# Patient Record
Sex: Male | Born: 1950
Health system: Southern US, Community
[De-identification: ages and names within clinical notes are randomized; demographics above are authoritative.]

## PROBLEM LIST (undated history)

## (undated) DIAGNOSIS — E785 Hyperlipidemia, unspecified: Secondary | ICD-10-CM

## (undated) HISTORY — PX: TONSILLECTOMY: SUR1361

## (undated) HISTORY — DX: Hyperlipidemia, unspecified: E78.5

---

## 2014-08-08 LAB — HM COLONOSCOPY

## 2015-08-09 DIAGNOSIS — H2513 Age-related nuclear cataract, bilateral: Secondary | ICD-10-CM | POA: Diagnosis not present

## 2015-08-09 DIAGNOSIS — Z79899 Other long term (current) drug therapy: Secondary | ICD-10-CM | POA: Diagnosis not present

## 2015-08-09 DIAGNOSIS — Z125 Encounter for screening for malignant neoplasm of prostate: Secondary | ICD-10-CM | POA: Diagnosis not present

## 2015-08-09 DIAGNOSIS — Q828 Other specified congenital malformations of skin: Secondary | ICD-10-CM | POA: Diagnosis not present

## 2015-08-09 DIAGNOSIS — H16223 Keratoconjunctivitis sicca, not specified as Sjogren's, bilateral: Secondary | ICD-10-CM | POA: Diagnosis not present

## 2015-08-09 DIAGNOSIS — Z23 Encounter for immunization: Secondary | ICD-10-CM | POA: Diagnosis not present

## 2015-08-09 DIAGNOSIS — E785 Hyperlipidemia, unspecified: Secondary | ICD-10-CM | POA: Diagnosis not present

## 2016-02-12 DIAGNOSIS — E785 Hyperlipidemia, unspecified: Secondary | ICD-10-CM | POA: Diagnosis not present

## 2016-02-12 DIAGNOSIS — L821 Other seborrheic keratosis: Secondary | ICD-10-CM | POA: Diagnosis not present

## 2016-02-12 DIAGNOSIS — Z79899 Other long term (current) drug therapy: Secondary | ICD-10-CM | POA: Diagnosis not present

## 2016-02-12 DIAGNOSIS — Q828 Other specified congenital malformations of skin: Secondary | ICD-10-CM | POA: Diagnosis not present

## 2016-02-12 DIAGNOSIS — L570 Actinic keratosis: Secondary | ICD-10-CM | POA: Diagnosis not present

## 2016-02-12 DIAGNOSIS — Z1283 Encounter for screening for malignant neoplasm of skin: Secondary | ICD-10-CM | POA: Diagnosis not present

## 2016-02-12 DIAGNOSIS — Z125 Encounter for screening for malignant neoplasm of prostate: Secondary | ICD-10-CM | POA: Diagnosis not present

## 2016-02-14 DIAGNOSIS — E785 Hyperlipidemia, unspecified: Secondary | ICD-10-CM | POA: Diagnosis not present

## 2016-02-14 DIAGNOSIS — Z23 Encounter for immunization: Secondary | ICD-10-CM | POA: Diagnosis not present

## 2016-02-14 DIAGNOSIS — Z0000 Encounter for general adult medical examination without abnormal findings: Secondary | ICD-10-CM | POA: Diagnosis not present

## 2016-02-14 DIAGNOSIS — Z79899 Other long term (current) drug therapy: Secondary | ICD-10-CM | POA: Diagnosis not present

## 2016-08-05 DIAGNOSIS — H16223 Keratoconjunctivitis sicca, not specified as Sjogren's, bilateral: Secondary | ICD-10-CM | POA: Diagnosis not present

## 2016-08-05 DIAGNOSIS — L570 Actinic keratosis: Secondary | ICD-10-CM | POA: Diagnosis not present

## 2016-08-05 DIAGNOSIS — H2513 Age-related nuclear cataract, bilateral: Secondary | ICD-10-CM | POA: Diagnosis not present

## 2016-08-05 DIAGNOSIS — D485 Neoplasm of uncertain behavior of skin: Secondary | ICD-10-CM | POA: Diagnosis not present

## 2016-08-05 DIAGNOSIS — L738 Other specified follicular disorders: Secondary | ICD-10-CM | POA: Diagnosis not present

## 2016-08-09 DIAGNOSIS — Z0000 Encounter for general adult medical examination without abnormal findings: Secondary | ICD-10-CM | POA: Diagnosis not present

## 2016-08-09 DIAGNOSIS — Z23 Encounter for immunization: Secondary | ICD-10-CM | POA: Diagnosis not present

## 2016-08-09 DIAGNOSIS — E785 Hyperlipidemia, unspecified: Secondary | ICD-10-CM | POA: Diagnosis not present

## 2016-08-15 DIAGNOSIS — Z4802 Encounter for removal of sutures: Secondary | ICD-10-CM | POA: Diagnosis not present

## 2017-02-17 DIAGNOSIS — Z1283 Encounter for screening for malignant neoplasm of skin: Secondary | ICD-10-CM | POA: Diagnosis not present

## 2017-02-17 DIAGNOSIS — L820 Inflamed seborrheic keratosis: Secondary | ICD-10-CM | POA: Diagnosis not present

## 2017-02-17 DIAGNOSIS — L570 Actinic keratosis: Secondary | ICD-10-CM | POA: Diagnosis not present

## 2017-02-17 DIAGNOSIS — L821 Other seborrheic keratosis: Secondary | ICD-10-CM | POA: Diagnosis not present

## 2017-02-17 DIAGNOSIS — E785 Hyperlipidemia, unspecified: Secondary | ICD-10-CM | POA: Diagnosis not present

## 2017-04-03 DIAGNOSIS — Z23 Encounter for immunization: Secondary | ICD-10-CM | POA: Diagnosis not present

## 2017-08-20 DIAGNOSIS — Z1283 Encounter for screening for malignant neoplasm of skin: Secondary | ICD-10-CM | POA: Diagnosis not present

## 2017-08-20 DIAGNOSIS — L718 Other rosacea: Secondary | ICD-10-CM | POA: Diagnosis not present

## 2017-08-20 DIAGNOSIS — L570 Actinic keratosis: Secondary | ICD-10-CM | POA: Diagnosis not present

## 2017-08-26 DIAGNOSIS — R82998 Other abnormal findings in urine: Secondary | ICD-10-CM | POA: Diagnosis not present

## 2017-08-26 DIAGNOSIS — M859 Disorder of bone density and structure, unspecified: Secondary | ICD-10-CM | POA: Diagnosis not present

## 2017-08-26 DIAGNOSIS — E298 Other testicular dysfunction: Secondary | ICD-10-CM | POA: Diagnosis not present

## 2017-08-26 DIAGNOSIS — Z125 Encounter for screening for malignant neoplasm of prostate: Secondary | ICD-10-CM | POA: Diagnosis not present

## 2017-08-26 DIAGNOSIS — J302 Other seasonal allergic rhinitis: Secondary | ICD-10-CM | POA: Diagnosis not present

## 2017-08-26 DIAGNOSIS — R972 Elevated prostate specific antigen [PSA]: Secondary | ICD-10-CM | POA: Diagnosis not present

## 2017-08-26 DIAGNOSIS — Z1389 Encounter for screening for other disorder: Secondary | ICD-10-CM | POA: Diagnosis not present

## 2017-08-26 DIAGNOSIS — Z6823 Body mass index (BMI) 23.0-23.9, adult: Secondary | ICD-10-CM | POA: Diagnosis not present

## 2017-08-26 DIAGNOSIS — Z0000 Encounter for general adult medical examination without abnormal findings: Secondary | ICD-10-CM | POA: Diagnosis not present

## 2017-08-26 DIAGNOSIS — E7800 Pure hypercholesterolemia, unspecified: Secondary | ICD-10-CM | POA: Diagnosis not present

## 2017-08-27 LAB — TSH: TSH: 1.45 (ref <=5.90)

## 2017-09-10 DIAGNOSIS — M859 Disorder of bone density and structure, unspecified: Secondary | ICD-10-CM | POA: Diagnosis not present

## 2017-09-11 DIAGNOSIS — Z1212 Encounter for screening for malignant neoplasm of rectum: Secondary | ICD-10-CM | POA: Diagnosis not present

## 2017-10-01 DIAGNOSIS — J209 Acute bronchitis, unspecified: Secondary | ICD-10-CM | POA: Diagnosis not present

## 2018-02-24 DIAGNOSIS — L709 Acne, unspecified: Secondary | ICD-10-CM | POA: Diagnosis not present

## 2018-02-24 DIAGNOSIS — Z23 Encounter for immunization: Secondary | ICD-10-CM | POA: Diagnosis not present

## 2018-02-24 DIAGNOSIS — Z6822 Body mass index (BMI) 22.0-22.9, adult: Secondary | ICD-10-CM | POA: Diagnosis not present

## 2018-04-17 DIAGNOSIS — H04123 Dry eye syndrome of bilateral lacrimal glands: Secondary | ICD-10-CM | POA: Diagnosis not present

## 2018-04-17 DIAGNOSIS — H2513 Age-related nuclear cataract, bilateral: Secondary | ICD-10-CM | POA: Diagnosis not present

## 2018-04-17 DIAGNOSIS — H5213 Myopia, bilateral: Secondary | ICD-10-CM | POA: Diagnosis not present

## 2018-08-03 DIAGNOSIS — L578 Other skin changes due to chronic exposure to nonionizing radiation: Secondary | ICD-10-CM | POA: Diagnosis not present

## 2018-08-03 DIAGNOSIS — L718 Other rosacea: Secondary | ICD-10-CM | POA: Diagnosis not present

## 2018-08-03 DIAGNOSIS — L820 Inflamed seborrheic keratosis: Secondary | ICD-10-CM | POA: Diagnosis not present

## 2018-08-18 DIAGNOSIS — R82998 Other abnormal findings in urine: Secondary | ICD-10-CM | POA: Diagnosis not present

## 2018-08-18 DIAGNOSIS — Z125 Encounter for screening for malignant neoplasm of prostate: Secondary | ICD-10-CM | POA: Diagnosis not present

## 2018-08-18 DIAGNOSIS — E7800 Pure hypercholesterolemia, unspecified: Secondary | ICD-10-CM | POA: Diagnosis not present

## 2018-08-18 DIAGNOSIS — E291 Testicular hypofunction: Secondary | ICD-10-CM | POA: Diagnosis not present

## 2018-08-18 LAB — LIPID PANEL
Cholesterol: 160 (ref 0–200)
HDL: 46 (ref 35–70)
LDL Cholesterol: 87
Triglycerides: 135 (ref 40–160)

## 2018-08-18 LAB — PSA: PSA: 0.872

## 2018-08-18 LAB — BASIC METABOLIC PANEL
BUN: 13 (ref 4–21)
Creatinine: 1.1 (ref <=1.3)
Glucose: 97

## 2018-08-26 DIAGNOSIS — Z1331 Encounter for screening for depression: Secondary | ICD-10-CM | POA: Diagnosis not present

## 2018-08-26 DIAGNOSIS — E7800 Pure hypercholesterolemia, unspecified: Secondary | ICD-10-CM | POA: Diagnosis not present

## 2018-08-26 DIAGNOSIS — Z6822 Body mass index (BMI) 22.0-22.9, adult: Secondary | ICD-10-CM | POA: Diagnosis not present

## 2018-08-26 DIAGNOSIS — M859 Disorder of bone density and structure, unspecified: Secondary | ICD-10-CM | POA: Diagnosis not present

## 2018-08-26 DIAGNOSIS — R972 Elevated prostate specific antigen [PSA]: Secondary | ICD-10-CM | POA: Diagnosis not present

## 2018-08-26 DIAGNOSIS — Z1339 Encounter for screening examination for other mental health and behavioral disorders: Secondary | ICD-10-CM | POA: Diagnosis not present

## 2018-08-26 DIAGNOSIS — J302 Other seasonal allergic rhinitis: Secondary | ICD-10-CM | POA: Diagnosis not present

## 2018-08-26 DIAGNOSIS — E291 Testicular hypofunction: Secondary | ICD-10-CM | POA: Diagnosis not present

## 2018-08-26 DIAGNOSIS — Z0000 Encounter for general adult medical examination without abnormal findings: Secondary | ICD-10-CM | POA: Diagnosis not present

## 2018-08-26 DIAGNOSIS — L708 Other acne: Secondary | ICD-10-CM | POA: Diagnosis not present

## 2018-09-01 DIAGNOSIS — Z1212 Encounter for screening for malignant neoplasm of rectum: Secondary | ICD-10-CM | POA: Diagnosis not present

## 2018-09-01 LAB — FECAL OCCULT BLOOD, GUAIAC: Fecal Occult Blood: NEGATIVE

## 2018-11-11 DIAGNOSIS — L280 Lichen simplex chronicus: Secondary | ICD-10-CM | POA: Diagnosis not present

## 2018-11-11 DIAGNOSIS — L821 Other seborrheic keratosis: Secondary | ICD-10-CM | POA: Diagnosis not present

## 2018-11-11 DIAGNOSIS — L814 Other melanin hyperpigmentation: Secondary | ICD-10-CM | POA: Diagnosis not present

## 2018-11-11 DIAGNOSIS — D1801 Hemangioma of skin and subcutaneous tissue: Secondary | ICD-10-CM | POA: Diagnosis not present

## 2018-11-11 DIAGNOSIS — L570 Actinic keratosis: Secondary | ICD-10-CM | POA: Diagnosis not present

## 2018-11-24 ENCOUNTER — Encounter: Payer: Self-pay | Admitting: Family Medicine

## 2018-11-24 ENCOUNTER — Ambulatory Visit (INDEPENDENT_AMBULATORY_CARE_PROVIDER_SITE_OTHER): Payer: Medicare Other | Admitting: Family Medicine

## 2018-11-24 ENCOUNTER — Other Ambulatory Visit: Payer: Self-pay

## 2018-11-24 DIAGNOSIS — E785 Hyperlipidemia, unspecified: Secondary | ICD-10-CM | POA: Diagnosis not present

## 2018-11-24 NOTE — Progress Notes (Signed)
Chief Complaint:  Jacob Hood is a 68 y.o. male who presents today for a virtual office visit with a chief complaint of dyslipidemia and to establish care.   Assessment/Plan:  Dyslipidemia Stable.  Continue simvastatin 20 mg daily.  Check lipid panel next blood draw.  Obtain records from previous PCP.  Preventative Healthcare Health Maintenance Due  Topic Date Due  . Hepatitis C Screening  1951-02-15  . PNA vac Low Risk Adult (1 of 2 - PCV13) 11/30/2013     Subjective:  HPI:  His stable, chronic medical conditions are outlined below:  # Dyslipidemia - On simvastatin 20mg  daily and tolerating well withoutside effects  - ROS: No reported myalgias.  ROS: Per HPI, otherwise a complete review of systems was negative.   PMH:  The following were reviewed and entered/updated in epic: Past Medical History:  Diagnosis Date  . Hyperlipidemia    Patient Active Problem List   Diagnosis Date Noted  . Dyslipidemia 11/24/2018   Past Surgical History:  Procedure Laterality Date  . TONSILLECTOMY      Family History  Problem Relation Age of Onset  . Dementia Mother   . Heart attack Father   . Cancer Neg Hx     Medications- reviewed and updated Current Outpatient Medications  Medication Sig Dispense Refill  . simvastatin (ZOCOR) 20 MG tablet Take 20 mg by mouth daily.     No current facility-administered medications for this visit.     Allergies-reviewed and updated Allergies  Allergen Reactions  . Penicillins     Social History   Socioeconomic History  . Marital status: Not on file    Spouse name: Not on file  . Number of children: Not on file  . Years of education: Not on file  . Highest education level: Not on file  Occupational History  . Not on file  Social Needs  . Financial resource strain: Not on file  . Food insecurity:    Worry: Not on file    Inability: Not on file  . Transportation needs:    Medical: Not on file    Non-medical: Not on  file  Tobacco Use  . Smoking status: Former Research scientist (life sciences)  . Smokeless tobacco: Never Used  Substance and Sexual Activity  . Alcohol use: Yes    Comment: Socially  . Drug use: Never  . Sexual activity: Yes  Lifestyle  . Physical activity:    Days per week: Not on file    Minutes per session: Not on file  . Stress: Not on file  Relationships  . Social connections:    Talks on phone: Not on file    Gets together: Not on file    Attends religious service: Not on file    Active member of club or organization: Not on file    Attends meetings of clubs or organizations: Not on file    Relationship status: Not on file  Other Topics Concern  . Not on file  Social History Narrative  . Not on file        Objective/Observations  Physical Exam: Gen: NAD, resting comfortably Eyes: Extraocular movements intact.  No scleral icterus Cardiovascular: No peripheral edema Pulm: Normal work of breathing Neuro: Grossly normal, moves all extremities MSK: No digital cyanosis.  Full range of motion throughout Skin: No rashes or lesions. Psych: Normal affect and thought content  Virtual Visit via Video   I connected with Jacob Hood on 11/24/18 at 10:00 AM EDT by a  video enabled telemedicine application and verified that I am speaking with the correct person using two identifiers. I discussed the limitations of evaluation and management by telemedicine and the availability of in person appointments. The patient expressed understanding and agreed to proceed.   Patient location: Home Provider location: [place] participating in the virtual visit: Myself and Patient     Algis Greenhouse. Jerline Pain, MD 11/24/2018 1:06 PM

## 2018-11-24 NOTE — Assessment & Plan Note (Signed)
Stable.  Continue simvastatin 20 mg daily.  Check lipid panel next blood draw.  Obtain records from previous PCP.

## 2018-12-03 ENCOUNTER — Telehealth: Payer: Medicare Other | Admitting: Family

## 2018-12-03 DIAGNOSIS — L0390 Cellulitis, unspecified: Secondary | ICD-10-CM

## 2018-12-03 MED ORDER — SULFAMETHOXAZOLE-TRIMETHOPRIM 800-160 MG PO TABS: 1.0000 | ORAL_TABLET | Freq: Two times a day (BID) | ORAL | 0 refills | Status: DC

## 2018-12-03 NOTE — Progress Notes (Signed)
Greater than 5 minutes, yet less than 10 minutes of time have been spent researching, coordinating, and implementing care for this patient today.  Thank you for the details you included in the comment boxes. Those details are very helpful in determining the best course of treatment for you and help [place] to provide the best care.  This presents more like an insect bite or scratch that is becoming cellulitis.   E Visit for Cellulitis  We are sorry that you are not feeling well. Here is how we plan to help!  Based on what you shared with me it looks like you have cellulitis.  Cellulitis looks like areas of skin redness, swelling, and warmth; it develops as a result of bacteria entering under the skin. Little red spots and/or bleeding can be seen in skin, and tiny surface sacs containing fluid can occur. Fever can be present. Cellulitis is almost always on one side of a body, and the lower limbs are the most common site of involvement.   I have prescribed:  Bactrim DS 1 tablet by mouth BID for 7 days  HOME CARE:  . Take your medications as ordered and take all of them, even if the skin irritation appears to be healing.   GET HELP RIGHT AWAY IF:  . Symptoms that don't begin to go away within 48 hours. . Severe redness persists or worsens . If the area turns color, spreads or swells. . If it blisters and opens, develops yellow-brown crust or bleeds. . You develop a fever or chills. . If the pain increases or becomes unbearable.  . Are unable to keep fluids and food down.  MAKE SURE YOU    Understand these instructions.  Will watch your condition.  Will get help right away if you are not doing well or get worse.  Thank you for choosing an e-visit. Your e-visit answers were reviewed by a board certified advanced clinical practitioner to complete your personal care plan. Depending upon the condition, your plan could have included both over the counter or prescription medications. Please  review your pharmacy choice. Make sure the pharmacy is open so you can pick up prescription now. If there is a problem, you may contact your provider through [employer] and have the prescription routed to another pharmacy. Your safety is important to [place]. If you have drug allergies check your prescription carefully.  For the next 24 hours you can use MyChart to ask questions about today's visit, request a non-urgent call back, or ask for a work or school excuse. You will get an email in the next two days asking about your experience. I hope that your e-visit has been valuable and will speed your recovery.

## 2018-12-05 DIAGNOSIS — L0100 Impetigo, unspecified: Secondary | ICD-10-CM | POA: Diagnosis not present

## 2018-12-15 DIAGNOSIS — Z1159 Encounter for screening for other viral diseases: Secondary | ICD-10-CM | POA: Diagnosis not present

## 2019-02-16 DIAGNOSIS — R55 Syncope and collapse: Secondary | ICD-10-CM | POA: Diagnosis not present

## 2019-02-16 DIAGNOSIS — I959 Hypotension, unspecified: Secondary | ICD-10-CM | POA: Diagnosis not present

## 2019-02-16 DIAGNOSIS — R61 Generalized hyperhidrosis: Secondary | ICD-10-CM | POA: Diagnosis not present

## 2019-02-16 DIAGNOSIS — E860 Dehydration: Secondary | ICD-10-CM | POA: Diagnosis not present

## 2019-02-16 DIAGNOSIS — T671XXA Heat syncope, initial encounter: Secondary | ICD-10-CM | POA: Diagnosis not present

## 2019-02-16 DIAGNOSIS — R42 Dizziness and giddiness: Secondary | ICD-10-CM | POA: Diagnosis not present

## 2019-04-03 DIAGNOSIS — Z23 Encounter for immunization: Secondary | ICD-10-CM | POA: Diagnosis not present

## 2019-04-16 ENCOUNTER — Telehealth: Payer: Medicare Other

## 2019-04-16 ENCOUNTER — Encounter (HOSPITAL_COMMUNITY): Payer: Self-pay

## 2019-04-16 ENCOUNTER — Other Ambulatory Visit: Payer: Self-pay

## 2019-04-16 ENCOUNTER — Ambulatory Visit (HOSPITAL_COMMUNITY)
Admission: EM | Admit: 2019-04-16 | Discharge: 2019-04-16 | Disposition: A | Discharge status: Home / Self Care | Payer: Medicare Other | Attending: Urgent Care | Admitting: Urgent Care

## 2019-04-16 DIAGNOSIS — Z20828 Contact with and (suspected) exposure to other viral communicable diseases: Secondary | ICD-10-CM | POA: Insufficient documentation

## 2019-04-16 DIAGNOSIS — R519 Headache, unspecified: Secondary | ICD-10-CM | POA: Insufficient documentation

## 2019-04-16 DIAGNOSIS — R0981 Nasal congestion: Secondary | ICD-10-CM | POA: Insufficient documentation

## 2019-04-16 DIAGNOSIS — Z880 Allergy status to penicillin: Secondary | ICD-10-CM | POA: Insufficient documentation

## 2019-04-16 DIAGNOSIS — R42 Dizziness and giddiness: Secondary | ICD-10-CM | POA: Insufficient documentation

## 2019-04-16 DIAGNOSIS — E785 Hyperlipidemia, unspecified: Secondary | ICD-10-CM | POA: Diagnosis not present

## 2019-04-16 MED ORDER — CETIRIZINE HCL 10 MG PO TABS: 10.0000 mg | ORAL_TABLET | Freq: Every day | ORAL | 0 refills | Status: DC

## 2019-04-16 MED ORDER — MECLIZINE HCL 12.5 MG PO TABS: 12.5000 mg | ORAL_TABLET | Freq: Three times a day (TID) | ORAL | 0 refills | Status: DC | PRN

## 2019-04-16 NOTE — ED Triage Notes (Signed)
Patient presents to Urgent Care with complaints of general unwell, feeling dizzy and fatigued intermittently with occasional headache and sinus congestion. Patient reports he has also been very thirsty recently. Denies sick contacts or fever.

## 2019-04-16 NOTE — Discharge Instructions (Addendum)
For sore throat or cough try using a honey-based tea. Use 3 teaspoons of honey with juice squeezed from half lemon. Place shaved pieces of ginger into 1/2-1 cup of water and warm over stove top. Then mix the ingredients and repeat every 4 hours as needed. Please take Tylenol 500mg  every 6 hours. Hydrate very well with at least 2 liters of water. Eat light meals such as soups to replenish electrolytes and soft fruits, veggies. Start an antihistamine like Zyrtec, Allegra or Claritin for postnasal drainage, sinus congestion.  You can take this together with pseudoephedrine (Sudafed) at a dose of 60 mg 3 times a day as needed for the same kind of congestion.

## 2019-04-16 NOTE — ED Provider Notes (Signed)
MRN: [id number] DOB: 1951-05-28  Subjective:   Jacob Hood is a 68 y.o. male presenting for 3 day hx of intermittent dizziness. Was doing yoga at the time, was in an inverted position. Started having mild intermittent headache, nasal congestion, fatigue. Has tried ibuprofen with some relief. Last labs from February, March 2020 were negative for TSH, basic metabolic panel, lipid panel, PSA and fecal occult blood testing.   No current facility-administered medications for this encounter.   Current Outpatient Medications:  .  simvastatin (ZOCOR) 20 MG tablet, Take 20 mg by mouth daily., Disp: , Rfl:     Allergies  Allergen Reactions  . Penicillins     Past Medical History:  Diagnosis Date  . Hyperlipidemia      Past Surgical History:  Procedure Laterality Date  . TONSILLECTOMY      Review of Systems  Constitutional: Positive for malaise/fatigue. Negative for fever.  HENT: Positive for congestion. Negative for ear pain, sinus pain and sore throat.   Eyes: Negative for discharge and redness.  Respiratory: Negative for cough, hemoptysis, shortness of breath and wheezing.   Cardiovascular: Negative for chest pain.  Gastrointestinal: Negative for abdominal pain, diarrhea, nausea and vomiting.  Genitourinary: Negative for dysuria, flank pain and hematuria.  Musculoskeletal: Negative for myalgias.  Skin: Negative for rash.  Neurological: Positive for dizziness and headaches. Negative for weakness.  Psychiatric/Behavioral: Negative for depression and substance abuse.     Objective:   Vitals: BP (!) 171/94 (BP Location: Left Arm)   Pulse 62   Temp 98.2 F (36.8 C) (Oral)   Resp 17   SpO2 100%   BP Readings from Last 3 Encounters:  04/16/19 (!) 171/94   BP was 145/66 on recheck by PA-Shiron Whetsel at 11:32, left arm, sitting position.   Physical Exam Constitutional:      Appearance: Normal appearance. He is well-developed and normal weight.  HENT:     Head: Normocephalic  and atraumatic.     Right Ear: External ear normal.     Left Ear: External ear normal.     Nose: Nose normal.     Mouth/Throat:     Pharynx: Oropharynx is clear.  Eyes:     General: No scleral icterus.    Extraocular Movements: Extraocular movements intact.     Pupils: Pupils are equal, round, and reactive to light.  Cardiovascular:     Rate and Rhythm: Normal rate and regular rhythm.     Heart sounds: No murmur. No friction rub. No gallop.   Pulmonary:     Effort: Pulmonary effort is normal. No respiratory distress.     Breath sounds: No wheezing or rales.  Abdominal:     General: Bowel sounds are normal. There is no distension.     Palpations: Abdomen is soft. There is no mass.     Tenderness: There is no abdominal tenderness. There is no guarding or rebound.  Skin:    General: Skin is warm and dry.  Neurological:     Mental Status: He is alert and oriented to person, place, and time.     Cranial Nerves: No cranial nerve deficit.     Motor: No weakness.     Coordination: Coordination normal.     Deep Tendon Reflexes: Reflexes normal.  Psychiatric:        Mood and Affect: Mood normal.        Behavior: Behavior normal.        Thought Content: Thought content normal.  Judgment: Judgment normal.     Assessment and Plan :   1. Dizziness   2. Sinus congestion   3. Generalized headaches     Blood pressure recheck very reassuring, physical exam findings also reassuring.  COVID-19 testing pending.  Counseled patient on differential which includes BPPV, allergic rhinitis, viral illness.  Will manage conservatively with supportive care.  Patient states that he is going to set up a follow-up ASAP with his PCP. Counseled patient on potential for adverse effects with medications prescribed/recommended today, ER and return-to-clinic precautions discussed, patient verbalized understanding.    Jaynee Eagles, PA-C 04/16/19 1144

## 2019-04-18 LAB — NOVEL CORONAVIRUS, NAA (HOSP ORDER, SEND-OUT TO REF LAB; TAT 18-24 HRS): SARS-CoV-2, NAA: NOT DETECTED

## 2019-04-23 DIAGNOSIS — H524 Presbyopia: Secondary | ICD-10-CM | POA: Diagnosis not present

## 2019-04-23 DIAGNOSIS — H2513 Age-related nuclear cataract, bilateral: Secondary | ICD-10-CM | POA: Diagnosis not present

## 2019-04-23 DIAGNOSIS — H04123 Dry eye syndrome of bilateral lacrimal glands: Secondary | ICD-10-CM | POA: Diagnosis not present

## 2019-05-14 ENCOUNTER — Telehealth: Payer: Medicare Other | Admitting: Nurse Practitioner

## 2019-05-14 DIAGNOSIS — R21 Rash and other nonspecific skin eruption: Secondary | ICD-10-CM | POA: Diagnosis not present

## 2019-05-14 MED ORDER — SULFAMETHOXAZOLE-TRIMETHOPRIM 800-160 MG PO TABS: 1.0000 | ORAL_TABLET | Freq: Two times a day (BID) | ORAL | 0 refills | Status: AC

## 2019-05-14 NOTE — Progress Notes (Signed)
E Visit for Rash  We are sorry that you are not feeling well. Here is how we plan to help!  I am prescribing Bactrim DS 800/160mg  tablets, take one tablet by mouth daily for 5 days.   HOME CARE:   Take cool showers and avoid direct sunlight.  Apply cool compress or wet dressings.  Take a bath in an oatmeal bath.  Sprinkle content of one Aveeno packet under running faucet with comfortably warm water.  Bathe for 15-20 minutes, 1-2 times daily.  Pat dry with a towel. Do not rub the rash.  For itching, use hydrocortisone cream. Take an antihistamine like Benadryl for widespread rashes that itch.  The adult dose of Benadryl is 25-50 mg by mouth 4 times daily. Caution:  This type of medication may cause sleepiness.  Do not drink alcohol, drive, or operate dangerous machinery while taking antihistamines.  Do not take these medications if you have prostate enlargement.  Read package instructions thoroughly on all medications that you take.  GET HELP RIGHT AWAY IF:   Symptoms don't go away after treatment.  Severe itching that persists.  If you rash spreads or swells.  If you rash begins to smell.  If it blisters and opens or develops a yellow-brown crust.  You develop a fever.  You have a sore throat.  You become short of breath.  MAKE SURE YOU:  Understand these instructions. Will watch your condition. Will get help right away if you are not doing well or get worse.  Thank you for choosing an e-visit. Your e-visit answers were reviewed by a board certified advanced clinical practitioner to complete your personal care plan. Depending upon the condition, your plan could have included both over the counter or prescription medications. Please review your pharmacy choice. Be sure that the pharmacy you have chosen is open so that you can pick up your prescription now.  If there is a problem you may message your provider in [place] to have the prescription routed to another  pharmacy. Your safety is important to [place]. If you have drug allergies check your prescription carefully.  For the next 24 hours, you can use MyChart to ask questions about today's visit, request a non-urgent call back, or ask for a work or school excuse from your e-visit provider. You will get an email in the next two days asking about your experience. I hope that your e-visit has been valuable and will speed your recovery.  I have spent at least 5 minutes reviewing and documenting in the patient's chart.

## 2019-08-04 ENCOUNTER — Telehealth: Payer: Medicare Other | Admitting: Family

## 2019-08-04 DIAGNOSIS — R6889 Other general symptoms and signs: Secondary | ICD-10-CM

## 2019-08-04 NOTE — Progress Notes (Signed)
We are sorry you are not feeling well. We are here to help!  Hello, it seems like you are having a reaction to your vaccine. I would take tylenol every 6 hours, rest, force fluids. If your symptoms worsen you need to be seen face to face to rule out other serious infections.   Please continue isolation at home, for at least 10 days since the start of your symptoms and until you have had 24 hours with no fever (without taking a fever reducer) and with improving of symptoms.  Please continue good preventive care measures, including:  frequent hand-washing, avoid touching your face, cover coughs/sneezes, stay out of crowds and keep a 6 foot distance from others.  Follow up with your provider or go to the nearest hospital ED for re-assessment if fever/cough/breathlessness return.  The following symptoms may appear 2-14 days after exposure: . Fever . Cough . Shortness of breath or difficulty breathing . Chills . Repeated shaking with chills . Muscle pain . Headache . Sore throat . New loss of taste or smell . Fatigue . Congestion or runny nose . Nausea or vomiting . Diarrhea  Go to the nearest hospital ED for assessment if fever/cough/breathlessness are severe or illness seems like a threat to life.  It is vitally important that if you feel that you have an infection such as this virus or any other virus that you stay home and away from places where you may spread it to others.  You should avoid contact with people age 25 and older.    You may also take acetaminophen (Tylenol) as needed for fever.  Reduce your risk of any infection by using the same precautions used for avoiding the common cold or flu:  Marland Kitchen Wash your hands often with soap and warm water for at least 20 seconds.  If soap and water are not readily available, use an alcohol-based hand sanitizer with at least 60% alcohol.  . If coughing or sneezing, cover your mouth and nose by coughing or sneezing into the elbow areas of your shirt  or coat, into a tissue or into your sleeve (not your hands). . Avoid shaking hands with others and consider head nods or verbal greetings only. . Avoid touching your eyes, nose, or mouth with unwashed hands.  . Avoid close contact with people who are sick. . Avoid places or events with large numbers of people in one location, like concerts or sporting events. . Carefully consider travel plans you have or are making. . If you are planning any travel outside or inside the [place], visit the CDC's Travelers' Health webpage for the latest health notices. . If you have some symptoms but not all symptoms, continue to monitor at home and seek medical attention if your symptoms worsen. . If you are having a medical emergency, call 911.  HOME CARE . Only take medications as instructed by your medical team. . Drink plenty of fluids and get plenty of rest. . A steam or ultrasonic humidifier can help if you have congestion.   GET HELP RIGHT AWAY IF YOU HAVE EMERGENCY WARNING SIGNS** FOR COVID-19. If you or someone is showing any of these signs seek emergency medical care immediately. Call 911 or proceed to your closest emergency facility if: . You develop worsening high fever. . Trouble breathing . Bluish lips or face . Persistent pain or pressure in the chest . New confusion . Inability to wake or stay awake . You cough up blood. . Your symptoms  become more severe  **This list is not all possible symptoms. Contact your medical provider for any symptoms that are sever or concerning to you.  MAKE SURE YOU   Understand these instructions.  Will watch your condition.  Will get help right away if you are not doing well or get worse.  Your e-visit answers were reviewed by a board certified advanced clinical practitioner to complete your personal care plan.  Depending on the condition, your plan could have included both over the counter or prescription medications.  If there is a problem please reply  once you have received a response from your provider.  Your safety is important to [place].  If you have drug allergies check your prescription carefully.    You can use MyChart to ask questions about today's visit, request a non-urgent call back, or ask for a work or school excuse for 24 hours related to this e-Visit. If it has been greater than 24 hours you will need to follow up with your provider, or enter a new e-Visit to address those concerns. You will get an e-mail in the next two days asking about your experience.  I hope that your e-visit has been valuable and will speed your recovery. Thank you for using e-visits.   Approximately 5 minutes was spent documenting and reviewing patient's chart.

## 2019-08-18 ENCOUNTER — Other Ambulatory Visit: Payer: Self-pay | Admitting: Family

## 2019-09-08 ENCOUNTER — Other Ambulatory Visit: Payer: Self-pay

## 2019-09-08 ENCOUNTER — Ambulatory Visit (INDEPENDENT_AMBULATORY_CARE_PROVIDER_SITE_OTHER): Payer: Medicare Other | Admitting: Family Medicine

## 2019-09-08 ENCOUNTER — Encounter: Payer: Self-pay | Admitting: Family Medicine

## 2019-09-08 VITALS — BP 140/80 | HR 57 | Temp 97.6°F | Ht 68.0 in | Wt 154.0 lb

## 2019-09-08 DIAGNOSIS — Z125 Encounter for screening for malignant neoplasm of prostate: Secondary | ICD-10-CM

## 2019-09-08 DIAGNOSIS — Z0000 Encounter for general adult medical examination without abnormal findings: Secondary | ICD-10-CM

## 2019-09-08 DIAGNOSIS — E785 Hyperlipidemia, unspecified: Secondary | ICD-10-CM

## 2019-09-08 DIAGNOSIS — R030 Elevated blood-pressure reading, without diagnosis of hypertension: Secondary | ICD-10-CM | POA: Diagnosis not present

## 2019-09-08 LAB — COMPREHENSIVE METABOLIC PANEL
ALT: 24 U/L (ref 0–53)
AST: 23 U/L (ref 0–37)
Albumin: 4.3 g/dL (ref 3.5–5.2)
Alkaline Phosphatase: 43 U/L (ref 39–117)
BUN: 11 mg/dL (ref 6–23)
CO2: 27 mEq/L (ref 19–32)
Calcium: 9 mg/dL (ref 8.4–10.5)
Chloride: 102 mEq/L (ref 96–112)
Creatinine, Ser: 0.94 mg/dL (ref 0.40–1.50)
GFR: 79.16 mL/min (ref >60.00)
Glucose, Bld: 93 mg/dL (ref 70–99)
Potassium: 4 mEq/L (ref 3.5–5.1)
Sodium: 138 mEq/L (ref 135–145)
Total Bilirubin: 0.5 mg/dL (ref 0.2–1.2)
Total Protein: 6.8 g/dL (ref 6.0–8.3)

## 2019-09-08 LAB — URINALYSIS, ROUTINE W REFLEX MICROSCOPIC
Bilirubin Urine: NEGATIVE
Hgb urine dipstick: NEGATIVE
Ketones, ur: NEGATIVE
Leukocytes,Ua: NEGATIVE
Nitrite: NEGATIVE
RBC / HPF: NONE SEEN (ref 0–?)
Specific Gravity, Urine: <=1.005 — AB (ref 1.000–1.030)
Total Protein, Urine: NEGATIVE
Urine Glucose: NEGATIVE
Urobilinogen, UA: 0.2 (ref 0.0–1.0)
WBC, UA: NONE SEEN (ref 0–?)
pH: 6 (ref 5.0–8.0)

## 2019-09-08 LAB — CBC
HCT: 41.6 % (ref 39.0–52.0)
Hemoglobin: 13.7 g/dL (ref 13.0–17.0)
MCHC: 32.9 g/dL (ref 30.0–36.0)
MCV: 93.2 fl (ref 78.0–100.0)
Platelets: 181 10*3/uL (ref 150.0–400.0)
RBC: 4.46 Mil/uL (ref 4.22–5.81)
RDW: 14.1 % (ref 11.5–15.5)
WBC: 5.7 10*3/uL (ref 4.0–10.5)

## 2019-09-08 LAB — TSH: TSH: 1.65 u[IU]/mL (ref 0.35–4.50)

## 2019-09-08 LAB — PSA, MEDICARE: PSA: 0.95 ng/ml (ref 0.10–4.00)

## 2019-09-08 LAB — LIPID PANEL
Cholesterol: 179 mg/dL (ref 0–200)
HDL: 50.7 mg/dL (ref >39.00)
LDL Cholesterol: 96 mg/dL (ref 0–99)
NonHDL: 128.3
Total CHOL/HDL Ratio: 4
Triglycerides: 163 mg/dL — ABNORMAL HIGH (ref 0.0–149.0)
VLDL: 32.6 mg/dL (ref 0.0–40.0)

## 2019-09-08 MED ORDER — SIMVASTATIN 20 MG PO TABS: 20.0000 mg | ORAL_TABLET | Freq: Every day | ORAL | 3 refills | Status: DC

## 2019-09-08 NOTE — Patient Instructions (Signed)
It was very nice to see you today!  We will check blood work today.  Keep up the good work!  Come back in 1 year for your next annual check up with blood work or sooner if needed.   Take care, Dr Jerline Pain  Please try these tips to maintain a healthy lifestyle:   Eat at least 3 REAL meals and 1-2 snacks per day.  Aim for no more than 5 hours between eating.  If you eat breakfast, please do so within one hour of getting up.    Each meal should contain half fruits/vegetables, one quarter protein, and one quarter carbs (no bigger than a computer mouse)   Cut down on sweet beverages. This includes juice, soda, and sweet tea.     Drink at least 1 glass of water with each meal and aim for at least 8 glasses per day   Exercise at least 150 minutes every week.    Preventive Care 28 Years and Older, Male Preventive care refers to lifestyle choices and visits with your health care provider that can promote health and wellness. This includes:  A yearly physical exam. This is also called an annual well check.  Regular dental and eye exams.  Immunizations.  Screening for certain conditions.  Healthy lifestyle choices, such as diet and exercise. What can I expect for my preventive care visit? Physical exam Your health care provider will check:  Height and weight. These may be used to calculate body mass index (BMI), which is a measurement that tells if you are at a healthy weight.  Heart rate and blood pressure.  Your skin for abnormal spots. Counseling Your health care provider may ask you questions about:  Alcohol, tobacco, and drug use.  Emotional well-being.  Home and relationship well-being.  Sexual activity.  Eating habits.  History of falls.  Memory and ability to understand (cognition).  Work and work Statistician. What immunizations do I need?  Influenza (flu) vaccine  This is recommended every year. Tetanus, diphtheria, and pertussis (Tdap)  vaccine  You may need a Td booster every 10 years. Varicella (chickenpox) vaccine  You may need this vaccine if you have not already been vaccinated. Zoster (shingles) vaccine  You may need this after age 66. Pneumococcal conjugate (PCV13) vaccine  One dose is recommended after age 58. Pneumococcal polysaccharide (PPSV23) vaccine  One dose is recommended after age 77. Measles, mumps, and rubella (MMR) vaccine  You may need at least one dose of MMR if you were born in 1957 or later. You may also need a second dose. Meningococcal conjugate (MenACWY) vaccine  You may need this if you have certain conditions. Hepatitis A vaccine  You may need this if you have certain conditions or if you travel or work in places where you may be exposed to hepatitis A. Hepatitis B vaccine  You may need this if you have certain conditions or if you travel or work in places where you may be exposed to hepatitis B. Haemophilus influenzae type b (Hib) vaccine  You may need this if you have certain conditions. You may receive vaccines as individual doses or as more than one vaccine together in one shot (combination vaccines). Talk with your health care provider about the risks and benefits of combination vaccines. What tests do I need? Blood tests  Lipid and cholesterol levels. These may be checked every 5 years, or more frequently depending on your overall health.  Hepatitis C test.  Hepatitis B test. Screening  Lung cancer screening. You may have this screening every year starting at age 93 if you have a 30-pack-year history of smoking and currently smoke or have quit within the past 15 years.  Colorectal cancer screening. All adults should have this screening starting at age 16 and continuing until age 86. Your health care provider may recommend screening at age 52 if you are at increased risk. You will have tests every 1-10 years, depending on your results and the type of screening  test.  Prostate cancer screening. Recommendations will vary depending on your family history and other risks.  Diabetes screening. This is done by checking your blood sugar (glucose) after you have not eaten for a while (fasting). You may have this done every 1-3 years.  Abdominal aortic aneurysm (AAA) screening. You may need this if you are a current or former smoker.  Sexually transmitted disease (STD) testing. Follow these instructions at home: Eating and drinking  Eat a diet that includes fresh fruits and vegetables, whole grains, lean protein, and low-fat dairy products. Limit your intake of foods with high amounts of sugar, saturated fats, and salt.  Take vitamin and mineral supplements as recommended by your health care provider.  Do not drink alcohol if your health care provider tells you not to drink.  If you drink alcohol: ? Limit how much you have to 0-2 drinks a day. ? Be aware of how much alcohol is in your drink. In the [place] one drink equals one 12 oz bottle of beer (355 mL), one 5 oz glass of wine (148 mL), or one 1 oz glass of hard liquor (44 mL). Lifestyle  Take daily care of your teeth and gums.  Stay active. Exercise for at least 30 minutes on 5 or more days each week.  Do not use any products that contain nicotine or tobacco, such as cigarettes, e-cigarettes, and chewing tobacco. If you need help quitting, ask your health care provider.  If you are sexually active, practice safe sex. Use a condom or other form of protection to prevent STIs (sexually transmitted infections).  Talk with your health care provider about taking a low-dose aspirin or statin. What's next?  Visit your health care provider once a year for a well check visit.  Ask your health care provider how often you should have your eyes and teeth checked.  Stay up to date on all vaccines. This information is not intended to replace advice given to you by your health care provider. Make sure you  discuss any questions you have with your health care provider. Document Revised: 06/04/2018 Document Reviewed: 06/04/2018 Elsevier Patient Education  2020 [employer].

## 2019-09-08 NOTE — Assessment & Plan Note (Signed)
At goal per JNC 8.  Continue lifestyle modifications.  Check labs including CBC, C met, TSH, and UA.

## 2019-09-08 NOTE — Progress Notes (Signed)
Chief Complaint:  Jacob Hood is a 69 y.o. male who presents today for a subsequent Medicare Annual Wellness Visit and to discuss management of his chronic medical problems.  Assessment/Plan:  Chronic Problems Addressed Today: Elevated blood pressure reading At goal per JNC 8.  Continue lifestyle modifications.  Check labs including CBC, C met, TSH, and UA.  Dyslipidemia Check lipid panel, CBC, C met, TSH.  Will refill simvastatin 20 mg daily.  Preventative Healthcare Check PSA to screen for prostate cancer.  Has already received both Covid vaccines.  Has already received flu vaccine for the season.  Due for his next colonoscopy in 2026.  He thinks that he is already received both pneumonia vaccines but will be checking his medical records to make sure. Health Maintenance Due  Topic Date Due  . Hepatitis C Screening  Never done  . PNA vac Low Risk Adult (1 of 2 - PCV13) Never done   During the course of the visit the patient was educated and counseled about appropriate screening and preventive services including:        Fall prevention   Nutrition Physical Activity Weight Management Cognition    Subjective:  HPI:  Health Risk Assessment: Patient considers his overall health to be good. He has no difficulty performing the following: . Preparing food and eating . Bathing  . Getting dressed . Using the toilet . Shopping . Managing Finances . Moving around from place to place  He has not had any falls within the past year.   Depression screen [facility] 2/9 09/08/2019  Decreased Interest 0  Down, Depressed, Hopeless 0  PHQ - 2 Score 0  Altered sleeping 0  Tired, decreased energy 0  Change in appetite 0  Feeling bad or failure about yourself  0  Trouble concentrating 0  Moving slowly or fidgety/restless 0  Suicidal thoughts 0  PHQ-9 Score 0   Lifestyle Factors: Diet: None specific.  Exercise: Limited. Trying to get back into running.   Patient Care  Team: Vivi Barrack, MD as PCP - General (Family Medicine)   He has no acute complaints today.   ROS: Per HPI, otherwise a complete review of systems was negative.   PMH:  The following were reviewed and entered/updated in epic: Past Medical History:  Diagnosis Date  . Hyperlipidemia    Patient Active Problem List   Diagnosis Date Noted  . Elevated blood pressure reading 09/08/2019  . Dyslipidemia 11/24/2018   Past Surgical History:  Procedure Laterality Date  . TONSILLECTOMY      Family History  Problem Relation Age of Onset  . Dementia Mother   . Heart attack Father   . Cancer Neg Hx     Medications- reviewed and updated Current Outpatient Medications  Medication Sig Dispense Refill  . simvastatin (ZOCOR) 20 MG tablet Take 1 tablet (20 mg total) by mouth daily. 90 tablet 3   No current facility-administered medications for this visit.    Allergies-reviewed and updated Allergies  Allergen Reactions  . Penicillins     Social History   Socioeconomic History  . Marital status: Married    Spouse name: Not on file  . Number of children: Not on file  . Years of education: Not on file  . Highest education level: Not on file  Occupational History  . Not on file  Tobacco Use  . Smoking status: Former Research scientist (life sciences)  . Smokeless tobacco: Never Used  Substance and Sexual Activity  . Alcohol use: Yes  Comment: Socially  . Drug use: Never  . Sexual activity: Yes  Other Topics Concern  . Not on file  Social History Narrative  . Not on file   Social Determinants of Health   Financial Resource Strain:   . Difficulty of Paying Living Expenses:   Food Insecurity:   . Worried About Charity fundraiser in the Last Year:   . Arboriculturist in the Last Year:   Transportation Needs:   . Film/video editor (Medical):   Marland Kitchen Lack of Transportation (Non-Medical):   Physical Activity:   . Days of Exercise per Week:   . Minutes of Exercise per Session:   Stress:    . Feeling of Stress :   Social Connections:   . Frequency of Communication with Friends and Family:   . Frequency of Social Gatherings with Friends and Family:   . Attends Religious Services:   . Active Member of Clubs or Organizations:   . Attends Archivist Meetings:   Marland Kitchen Marital Status:          Objective/Observations  Physical Exam: BP 140/80   Pulse (!) 57   Temp 97.6 F (36.4 C) (Temporal)   Ht _0  (1.727 m)   Wt 154 lb (69.9 kg)   SpO2 98%   BMI 23.42 kg/m  Gen: NAD, resting comfortably HEENT: TMs normal bilaterally. OP clear. No thyromegaly noted.  CV: RRR with no murmurs appreciated Pulm: NWOB, CTAB with no crackles, wheezes, or rhonchi GI: Normal bowel sounds present. Soft, Nontender, Nondistended. MSK: no edema, cyanosis, or clubbing noted Skin: warm, dry Neuro: CN2-12 grossly intact. Strength 5/5 in upper and lower extremities. Reflexes symmetric and intact bilaterally. Normal minicog.  Psych: Normal affect and thought content      Quintina Hakeem M. Jerline Pain, MD 09/08/2019 10:11 AM

## 2019-09-08 NOTE — Assessment & Plan Note (Signed)
Check lipid panel, CBC, C met, TSH.  Will refill simvastatin 20 mg daily.

## 2019-09-09 NOTE — Progress Notes (Signed)
Please inform patient of the following:  His triglycerides are slightly up, but the rest of his blood work is all STABLE. Do not need to make any changes at this time. Would like for him to continue his current treatment plan and we can recheck in a year or so.  Algis Greenhouse. Jerline Pain, MD 09/09/2019 12:59 PM

## 2019-10-14 ENCOUNTER — Encounter: Payer: Self-pay | Admitting: Family Medicine

## 2019-11-11 DIAGNOSIS — L718 Other rosacea: Secondary | ICD-10-CM | POA: Diagnosis not present

## 2019-11-11 DIAGNOSIS — L281 Prurigo nodularis: Secondary | ICD-10-CM | POA: Diagnosis not present

## 2019-11-11 DIAGNOSIS — L821 Other seborrheic keratosis: Secondary | ICD-10-CM | POA: Diagnosis not present

## 2019-11-11 DIAGNOSIS — L814 Other melanin hyperpigmentation: Secondary | ICD-10-CM | POA: Diagnosis not present

## 2019-11-11 DIAGNOSIS — D1801 Hemangioma of skin and subcutaneous tissue: Secondary | ICD-10-CM | POA: Diagnosis not present

## 2020-02-25 ENCOUNTER — Ambulatory Visit (INDEPENDENT_AMBULATORY_CARE_PROVIDER_SITE_OTHER): Payer: Medicare Other | Admitting: Family Medicine

## 2020-02-25 ENCOUNTER — Other Ambulatory Visit: Payer: Self-pay

## 2020-02-25 ENCOUNTER — Encounter: Payer: Self-pay | Admitting: Family Medicine

## 2020-02-25 ENCOUNTER — Ambulatory Visit: Payer: Self-pay

## 2020-02-25 VITALS — BP 156/86 | HR 55 | Ht 68.0 in | Wt 153.8 lb

## 2020-02-25 DIAGNOSIS — M79644 Pain in right finger(s): Secondary | ICD-10-CM

## 2020-02-25 DIAGNOSIS — M25831 Other specified joint disorders, right wrist: Secondary | ICD-10-CM | POA: Diagnosis not present

## 2020-02-25 DIAGNOSIS — M25531 Pain in right wrist: Secondary | ICD-10-CM

## 2020-02-25 NOTE — Patient Instructions (Addendum)
Thank you for coming in today. Use Voltaren gel on the thumb.  Modify plant position for a straight wrist punch type position.   If not enough let me know and I will order hand physical therapy.   If not better enough I can inject the base of the thumb and wrist.   I think you have dorsal wrist impingement and arthritis at the base of the thumb.

## 2020-02-25 NOTE — Progress Notes (Signed)
    Subjective:    CC: R wrist pain  I, Jacob Hood, LAT, ATC, am serving as scribe for Dr. Lynne Leader.  HPI: Pt is a 69 y/o male presenting w/ c/o R wrist and thumb pain.  He locates his pain to the base of his R thumb and R dorsal wrist.  His R wrist pain and decreased ROM has been going on for a longer period of time and the R base of thumb pain is newer.  Radiating pain: No R wrist swelling: No Aggravating factors: loaded R wrist extension I.e. doing planks Treatments tried: Nothing  Pertinent review of Systems: No fevers or chills  Relevant historical information: Dyslipidemia   Objective:    Vitals:   02/25/20 1020  BP: (!) 156/86  Pulse: (!) 55  SpO2: 98%   General: Well Developed, well nourished, and in no acute distress.   MSK: Right wrist normal-appearing Normal flexion ulnar deviation radial deviation.  Slight limitation full extension. Nontender. Normal strength.  Right thumb bossing at the base of the thumb at [facility] otherwise normal.  Nontender normal motion.  Normal strength.  Lab and Radiology Results  Diagnostic Limited MSK Ultrasound of: Right base of thumb and wrist. Thumb CMC bossing and mild joint effusion present.  Consistent with mild to moderate arthritis. Right dorsal wrist no significant joint effusion or tenosynovitis changes present.  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 male with right wrist lack of range of motion.  Patient lacks full wrist extension and likely has some bony impingement causing this.  Discussed that this can be treated further if needed however most people will get good benefit out of modification of activity and hand positioning.  Discussed punch type straight wrist positioning for planks and push-ups etc.  Recommend trial of hand  therapy as needed if not sufficient followed by further evaluation in clinic.   Thumb pain.  Patient has arthritis changes at the base of the thumb.  Plan for Voltaren gel.  If not sufficient hand therapy is next step. .  Could consider injection as well..   Orders Placed This Encounter  Procedures  . [place] LIMITED JOINT SPACE STRUCTURES UP RIGHT(NO LINKED CHARGES)    Order Specific Question:   Reason for Exam (SYMPTOM  OR DIAGNOSIS REQUIRED)    Answer:   R wrist pain    Order Specific Question:   Preferred imaging location?    Answer:   [place]   No orders of the defined types were placed in this encounter.   Discussed warning signs or symptoms. Please see discharge instructions. Patient expresses understanding.   The above documentation has been reviewed and is accurate and complete Lynne Leader, M.D.

## 2020-03-22 ENCOUNTER — Ambulatory Visit: Payer: Medicare Other | Admitting: Family Medicine

## 2020-03-25 DIAGNOSIS — Z23 Encounter for immunization: Secondary | ICD-10-CM | POA: Diagnosis not present

## 2020-03-28 ENCOUNTER — Ambulatory Visit: Payer: Medicare Other | Attending: Internal Medicine

## 2020-03-28 DIAGNOSIS — Z23 Encounter for immunization: Secondary | ICD-10-CM

## 2020-03-28 NOTE — Progress Notes (Signed)
   Covid-19 Vaccination Clinic  Name:  Jacob Hood    MRN: [id number] DOB: 06-Jul-1950  03/28/2020  Mr. Jacob Hood was observed post Covid-19 immunization for 15 minutes without incident. He was provided with Vaccine Information Sheet and instruction to access the V-Safe system.   Mr. Jacob Hood was instructed to call 911 with any severe reactions post vaccine: Marland Kitchen Difficulty breathing  . Swelling of face and throat  . A fast heartbeat  . A bad rash all over body  . Dizziness and weakness

## 2020-04-24 DIAGNOSIS — H524 Presbyopia: Secondary | ICD-10-CM | POA: Diagnosis not present

## 2020-04-24 DIAGNOSIS — H2513 Age-related nuclear cataract, bilateral: Secondary | ICD-10-CM | POA: Diagnosis not present

## 2020-08-25 ENCOUNTER — Other Ambulatory Visit: Payer: Self-pay | Admitting: *Deleted

## 2020-08-25 MED ORDER — SIMVASTATIN 20 MG PO TABS: 20.0000 mg | ORAL_TABLET | Freq: Every day | ORAL | 0 refills | Status: DC

## 2020-09-08 ENCOUNTER — Ambulatory Visit (INDEPENDENT_AMBULATORY_CARE_PROVIDER_SITE_OTHER): Payer: Medicare Other | Admitting: Family Medicine

## 2020-09-08 ENCOUNTER — Other Ambulatory Visit: Payer: Self-pay

## 2020-09-08 ENCOUNTER — Encounter: Payer: Self-pay | Admitting: Family Medicine

## 2020-09-08 VITALS — BP 131/74 | HR 59 | Temp 97.6°F | Ht 68.0 in | Wt 156.6 lb

## 2020-09-08 DIAGNOSIS — R55 Syncope and collapse: Secondary | ICD-10-CM | POA: Diagnosis not present

## 2020-09-08 DIAGNOSIS — M25831 Other specified joint disorders, right wrist: Secondary | ICD-10-CM

## 2020-09-08 DIAGNOSIS — E785 Hyperlipidemia, unspecified: Secondary | ICD-10-CM | POA: Diagnosis not present

## 2020-09-08 DIAGNOSIS — Z125 Encounter for screening for malignant neoplasm of prostate: Secondary | ICD-10-CM

## 2020-09-08 DIAGNOSIS — Z0000 Encounter for general adult medical examination without abnormal findings: Secondary | ICD-10-CM

## 2020-09-08 DIAGNOSIS — R351 Nocturia: Secondary | ICD-10-CM

## 2020-09-08 HISTORY — DX: Syncope and collapse: R55

## 2020-09-08 LAB — CBC
HCT: 41.1 % (ref 39.0–52.0)
Hemoglobin: 13.8 g/dL (ref 13.0–17.0)
MCHC: 33.6 g/dL (ref 30.0–36.0)
MCV: 92.3 fl (ref 78.0–100.0)
Platelets: 191 10*3/uL (ref 150.0–400.0)
RBC: 4.45 Mil/uL (ref 4.22–5.81)
RDW: 14 % (ref 11.5–15.5)
WBC: 5.3 10*3/uL (ref 4.0–10.5)

## 2020-09-08 LAB — URINALYSIS, ROUTINE W REFLEX MICROSCOPIC
Bilirubin Urine: NEGATIVE
Hgb urine dipstick: NEGATIVE
Ketones, ur: NEGATIVE
Leukocytes,Ua: NEGATIVE
Nitrite: NEGATIVE
Specific Gravity, Urine: <=1.005 — AB (ref 1.000–1.030)
Total Protein, Urine: NEGATIVE
Urine Glucose: NEGATIVE
Urobilinogen, UA: 0.2 (ref 0.0–1.0)
pH: 6 (ref 5.0–8.0)

## 2020-09-08 LAB — COMPREHENSIVE METABOLIC PANEL
ALT: 20 U/L (ref 0–53)
AST: 20 U/L (ref 0–37)
Albumin: 4.3 g/dL (ref 3.5–5.2)
Alkaline Phosphatase: 32 U/L — ABNORMAL LOW (ref 39–117)
BUN: 14 mg/dL (ref 6–23)
CO2: 30 mEq/L (ref 19–32)
Calcium: 9.6 mg/dL (ref 8.4–10.5)
Chloride: 104 mEq/L (ref 96–112)
Creatinine, Ser: 1.07 mg/dL (ref 0.40–1.50)
GFR: 69.69 mL/min (ref >60.00)
Glucose, Bld: 85 mg/dL (ref 70–99)
Potassium: 4.4 mEq/L (ref 3.5–5.1)
Sodium: 141 mEq/L (ref 135–145)
Total Bilirubin: 0.7 mg/dL (ref 0.2–1.2)
Total Protein: 6.6 g/dL (ref 6.0–8.3)

## 2020-09-08 LAB — LIPID PANEL
Cholesterol: 174 mg/dL (ref 0–200)
HDL: 43.7 mg/dL (ref >39.00)
NonHDL: 130.45
Total CHOL/HDL Ratio: 4
Triglycerides: 251 mg/dL — ABNORMAL HIGH (ref 0.0–149.0)
VLDL: 50.2 mg/dL — ABNORMAL HIGH (ref 0.0–40.0)

## 2020-09-08 LAB — TSH: TSH: 1.57 u[IU]/mL (ref 0.35–4.50)

## 2020-09-08 LAB — PSA: PSA: 1.13 ng/mL (ref 0.10–4.00)

## 2020-09-08 LAB — LDL CHOLESTEROL, DIRECT: Direct LDL: 90 mg/dL

## 2020-09-08 MED ORDER — SIMVASTATIN 20 MG PO TABS: 20.0000 mg | ORAL_TABLET | Freq: Every day | ORAL | 3 refills | Status: DC

## 2020-09-08 NOTE — Patient Instructions (Addendum)
It was very nice to see you today!  You have normal blood flow to your hands and feet.  You may have mild autonomic dysfunction.  In mild cases this is benign.  Please make sure that you are eating regularly and getting plenty of fluids.  I will place a referral for you to see a physical therapist.  We will check blood work today.  I will see back in year for your next annual physical.  Please come back to see me sooner if needed.  Take care, Dr Jerline Pain  PLEASE NOTE:  If you had any lab tests please let us know if you have not heard back within a few days. You may see your results on mychart before we have a chance to review them but we will give you a call once they are reviewed by [place]. If we ordered any referrals today, please let us know if you have not heard from their office within the next week.   Please try these tips to maintain a healthy lifestyle:   Eat at least 3 REAL meals and 1-2 snacks per day.  Aim for no more than 5 hours between eating.  If you eat breakfast, please do so within one hour of getting up.    Each meal should contain half fruits/vegetables, one quarter protein, and one quarter carbs (no bigger than a computer mouse)   Cut down on sweet beverages. This includes juice, soda, and sweet tea.     Drink at least 1 glass of water with each meal and aim for at least 8 glasses per day   Exercise at least 150 minutes every week.    Preventive Care 46 Years and Older, Male Preventive care refers to lifestyle choices and visits with your health care provider that can promote health and wellness. This includes:  A yearly physical exam. This is also called an annual wellness visit.  Regular dental and eye exams.  Immunizations.  Screening for certain conditions.  Healthy lifestyle choices, such as: ? Eating a healthy diet. ? Getting regular exercise. ? Not using drugs or products that contain nicotine and tobacco. ? Limiting alcohol use. What can I  expect for my preventive care visit? Physical exam Your health care provider will check your:  Height and weight. These may be used to calculate your BMI (body mass index). BMI is a measurement that tells if you are at a healthy weight.  Heart rate and blood pressure.  Body temperature.  Skin for abnormal spots. Counseling Your health care provider may ask you questions about your:  Past medical problems.  Family's medical history.  Alcohol, tobacco, and drug use.  Emotional well-being.  Home life and relationship well-being.  Sexual activity.  Diet, exercise, and sleep habits.  History of falls.  Memory and ability to understand (cognition).  Work and work Statistician.  Access to firearms. What immunizations do I need? Vaccines are usually given at various ages, according to a schedule. Your health care provider will recommend vaccines for you based on your age, medical history, and lifestyle or other factors, such as travel or where you work.   What tests do I need? Blood tests  Lipid and cholesterol levels. These may be checked every 5 years, or more often depending on your overall health.  Hepatitis C test.  Hepatitis B test. Screening  Lung cancer screening. You may have this screening every year starting at age 5 if you have a 30-pack-year history of smoking and  currently smoke or have quit within the past 15 years.  Colorectal cancer screening. ? All adults should have this screening starting at age 90 and continuing until age 61. ? Your health care provider may recommend screening at age 81 if you are at increased risk. ? You will have tests every 1-10 years, depending on your results and the type of screening test.  Prostate cancer screening. Recommendations will vary depending on your family history and other risks.  Genital exam to check for testicular cancer or hernias.  Diabetes screening. ? This is done by checking your blood sugar (glucose)  after you have not eaten for a while (fasting). ? You may have this done every 1-3 years.  Abdominal aortic aneurysm (AAA) screening. You may need this if you are a current or former smoker.  STD (sexually transmitted disease) testing, if you are at risk. Follow these instructions at home: Eating and drinking  Eat a diet that includes fresh fruits and vegetables, whole grains, lean protein, and low-fat dairy products. Limit your intake of foods with high amounts of sugar, saturated fats, and salt.  Take vitamin and mineral supplements as recommended by your health care provider.  Do not drink alcohol if your health care provider tells you not to drink.  If you drink alcohol: ? Limit how much you have to 0-2 drinks a day. ? Be aware of how much alcohol is in your drink. In the [place] one drink equals one 12 oz bottle of beer (355 mL), one 5 oz glass of wine (148 mL), or one 1 oz glass of hard liquor (44 mL).   Lifestyle  Take daily care of your teeth and gums. Brush your teeth every morning and night with fluoride toothpaste. Floss one time each day.  Stay active. Exercise for at least 30 minutes 5 or more days each week.  Do not use any products that contain nicotine or tobacco, such as cigarettes, e-cigarettes, and chewing tobacco. If you need help quitting, ask your health care provider.  Do not use drugs.  If you are sexually active, practice safe sex. Use a condom or other form of protection to prevent STIs (sexually transmitted infections).  Talk with your health care provider about taking a low-dose aspirin or statin.  Find healthy ways to cope with stress, such as: ? Meditation, yoga, or listening to music. ? Journaling. ? Talking to a trusted person. ? Spending time with friends and family. Safety  Always wear your seat belt while driving or riding in a vehicle.  Do not drive: ? If you have been drinking alcohol. Do not ride with someone who has been  drinking. ? When you are tired or distracted. ? While texting.  Wear a helmet and other protective equipment during sports activities.  If you have firearms in your house, make sure you follow all gun safety procedures. What's next?  Visit your health care provider once a year for an annual wellness visit.  Ask your health care provider how often you should have your eyes and teeth checked.  Stay up to date on all vaccines. This information is not intended to replace advice given to you by your health care provider. Make sure you discuss any questions you have with your health care provider. Document Revised: 03/09/2019 Document Reviewed: 06/04/2018 Elsevier Patient Education  2021 [employer].

## 2020-09-08 NOTE — Assessment & Plan Note (Addendum)
Patient with longstanding history of recurrent syncope.  Had 4 episodes over the last 15 years or so.  Most recently was last summer.  Each episode seems to occur in summer months.  He had some similar happen 2 years ago went to the ED and was diagnosed with heat syncope.  Will occasionally have prodromal syndromes.  May have mild autonomic dysfunction.  Given that he has not had any symptoms for the past several months and seems to be mostly related dehydration do not think we need to do any further work-up at this point.  We discussed referral to cardiology for further evaluation however deferred for today.  Encourage patient to stay well-hydrated and not go more than a few hours between eating snacks or meals.  He will let me know if he has recurrence of symptoms despite these measures and we will refer to cardiology at that point.  Discussed reasons to return to care and seek emergent care.

## 2020-09-08 NOTE — Assessment & Plan Note (Signed)
Check labs today.  Continue simvastatin 20 mg daily.

## 2020-09-08 NOTE — Progress Notes (Signed)
Chief Complaint:  Jacob Hood is a 70 y.o. male who presents today for a subsequent Medicare Annual Wellness Visit and to discuss management of his chronic medical problems.  Assessment/Plan:  Chronic Problems Addressed Today: Syncope Patient with longstanding history of recurrent syncope.  Had 4 episodes over the last 15 years or so.  Most recently was last summer.  Each episode seems to occur in summer months.  He had some similar happen 2 years ago went to the ED and was diagnosed with heat syncope.  Will occasionally have prodromal syndromes.  May have mild autonomic dysfunction.  Given that he has not had any symptoms for the past several months and seems to be mostly related dehydration do not think we need to do any further work-up at this point.  We discussed referral to cardiology for further evaluation however deferred for today.  Encourage patient to stay well-hydrated and not go more than a few hours between eating snacks or meals.  He will let me know if he has recurrence of symptoms despite these measures and we will refer to cardiology at that point.  Discussed reasons to return to care and seek emergent care.  Impingement syndrome of right wrist Will place referral to physical therapy.  Dyslipidemia Check labs today.  Continue simvastatin 20 mg daily.  Preventative Healthcare Due for next colonoscopy in 4 years.  We will check labs today including PSA.  He is up-to-date on Covid and flu vaccines.  He thinks he has had pneumonia vaccine done already and will check his records at home.  If he has not had pneumonia vaccine will likely need to get Prevnar 20. Health Maintenance Due  Topic Date Due  . Hepatitis C Screening  Never done  . PNA vac Low Risk Adult (1 of 2 - PCV13) Never done    During the course of the visit the patient was educated and counseled about appropriate screening and preventive services including:        Fall prevention   Nutrition Physical  Activity Weight Management Cognition    Subjective:  HPI:  Health Risk Assessment: Patient considers his overall health to be good. He has no difficulty performing the following: . Preparing food and eating . Bathing  . Getting dressed . Using the toilet . Shopping . Managing Finances . Moving around from place to place  He had one syncopal episode last summer related to likely dehydration.  Has not had any falls since then.  This has been a longstanding issue-see A/P.  Depression screen [facility] 2/9 09/08/2020  Decreased Interest 0  Down, Depressed, Hopeless 0  PHQ - 2 Score 0  Altered sleeping -  Tired, decreased energy -  Change in appetite -  Feeling bad or failure about yourself  -  Trouble concentrating -  Moving slowly or fidgety/restless -  Suicidal thoughts -  PHQ-9 Score -    Lifestyle Factors: Diet: Balanced. Exercise: Does yoga regularly.  Patient Care Team: Vivi Barrack, MD as PCP - General (Family Medicine)   He has no acute complaints today.   ROS: Per HPI, otherwise a complete review of systems was negative.   PMH:  The following were reviewed and entered/updated in epic: Past Medical History:  Diagnosis Date  . Hyperlipidemia    Patient Active Problem List   Diagnosis Date Noted  . Syncope 09/08/2020  . Impingement syndrome of right wrist 02/25/2020  . Elevated blood pressure reading 09/08/2019  . Dyslipidemia 11/24/2018  Past Surgical History:  Procedure Laterality Date  . TONSILLECTOMY      Family History  Problem Relation Age of Onset  . Dementia Mother   . Heart attack Father   . Cancer Neg Hx     Medications- reviewed and updated Current Outpatient Medications  Medication Sig Dispense Refill  . calcium-vitamin D (OSCAL WITH D) 500-200 MG-UNIT tablet Take 1 tablet by mouth.    . co-enzyme Q-10 30 MG capsule Take 30 mg by mouth 3 (three) times daily.    . Omega-3 Fatty Acids (FISH OIL) 1360 MG CAPS Take by mouth.    .  simvastatin (ZOCOR) 20 MG tablet Take 1 tablet (20 mg total) by mouth daily. 90 tablet 3   No current facility-administered medications for this visit.    Allergies-reviewed and updated Allergies  Allergen Reactions  . Penicillins     Social History   Socioeconomic History  . Marital status: Married    Spouse name: Not on file  . Number of children: Not on file  . Years of education: Not on file  . Highest education level: Not on file  Occupational History  . Not on file  Tobacco Use  . Smoking status: Former Research scientist (life sciences)  . Smokeless tobacco: Never Used  Vaping Use  . Vaping Use: Never used  Substance and Sexual Activity  . Alcohol use: Yes    Comment: Socially  . Drug use: Never  . Sexual activity: Yes  Other Topics Concern  . Not on file  Social History Narrative  . Not on file   Social Determinants of Health   Financial Resource Strain: Not on file  Food Insecurity: Not on file  Transportation Needs: Not on file  Physical Activity: Not on file  Stress: Not on file  Social Connections: Not on file         Objective/Observations  Physical Exam: BP 131/74   Pulse (!) 59   Temp 97.6 F (36.4 C) (Temporal)   Ht 5\' 8"  (1.727 m)   Wt 156 lb 9.6 oz (71 kg)   SpO2 97%   BMI 23.81 kg/m  Gen: NAD, resting comfortably HEENT: TMs normal bilaterally. OP clear. No thyromegaly noted.  CV: RRR with no murmurs appreciated Pulm: NWOB, CTAB with no crackles, wheezes, or rhonchi GI: Normal bowel sounds present. Soft, Nontender, Nondistended. MSK: no edema, cyanosis, or clubbing noted Skin: warm, dry Neuro: CN2-12 grossly intact. Strength 5/5 in upper and lower extremities. Reflexes symmetric and intact bilaterally. Normal minicog with 3/3 delayed word recall. Psych: Normal affect and thought content      Mathilda Maguire M. Jerline Pain, MD 09/08/2020 9:22 AM

## 2020-09-08 NOTE — Assessment & Plan Note (Signed)
Will place referral to physical therapy.

## 2020-09-11 NOTE — Progress Notes (Signed)
Please inform patient of the following:  Blood work is all stable. Would like for him to keep up the good work and we can recheck in a year.  Jacob Hood. Jerline Pain, MD 09/11/2020 8:40 AM

## 2020-09-14 ENCOUNTER — Ambulatory Visit (INDEPENDENT_AMBULATORY_CARE_PROVIDER_SITE_OTHER): Payer: Medicare Other | Admitting: Physical Therapy

## 2020-09-14 ENCOUNTER — Other Ambulatory Visit: Payer: Self-pay

## 2020-09-14 ENCOUNTER — Encounter: Payer: Self-pay | Admitting: Physical Therapy

## 2020-09-14 DIAGNOSIS — M79621 Pain in right upper arm: Secondary | ICD-10-CM

## 2020-09-14 DIAGNOSIS — M6281 Muscle weakness (generalized): Secondary | ICD-10-CM

## 2020-09-14 DIAGNOSIS — M25631 Stiffness of right wrist, not elsewhere classified: Secondary | ICD-10-CM | POA: Diagnosis not present

## 2020-09-14 NOTE — Patient Instructions (Signed)
Access Code: [email or web address] URL: [email or web address] Date: 09/14/2020 Prepared by: Daleen Bo  Exercises Seated Wrist Extension Stretch - 2 x daily - 7 x weekly - 1 sets - 3 reps - 30 hold Seated Wrist Extension PROM - 2 x daily - 7 x weekly - 1 sets - 1 reps - 72min hold

## 2020-09-14 NOTE — Therapy (Signed)
[facility] [address] [place], [place], [address] Phone: [phone]   Fax:  [phone]  Physical Therapy Evaluation  Patient Details  Name: Jacob Hood MRN: [id number] Date of Birth: Mar 20, 1951 Referring Provider (PT): Dr. Jerline Pain   Encounter Date: 09/14/2020   PT End of Session - 09/14/20 1941    Visit Number 1    Number of Visits 7    Date for PT Re-Evaluation 11/02/20    Authorization Type Medicare A and B    PT Start Time 1430    PT Stop Time 1515    PT Time Calculation (min) 45 min    Activity Tolerance Patient tolerated treatment well    Behavior During Therapy [facility] for tasks assessed/performed           Past Medical History:  Diagnosis Date  . Hyperlipidemia     Past Surgical History:  Procedure Laterality Date  . TONSILLECTOMY      There were no vitals filed for this visit.    Subjective Assessment - 09/14/20 2040    Subjective Pt states that the R wrist and thumb have been giving him pain on occasion, as well as pain in the R shoulder. He states he plays pickleball and yoga. He states he is on his computer an average of 4 hours a day. He goes to the gym 1-3x a week. He states that the R upper arm will be sore in the muscle of the deltoid but it is inconsistent and unsure if related. He states the bigesst issue is the elbow is "unable to lock out." He states he does not think it is related to strength. He think it is from the wrist. He wants to address the ability to perform daily activity and exercise. Pt states the R thumb/wrist has been going on for about 6 months. There does not seem to be a pattern for the pain to occur. Last time it occured, he was grabbing a glass and the pain came on. Pt denies popping clicking catching within the thumb. The pain will last a day or so and then goes away. Pt denies changes in fine motor function. Pt states his shoulder has also occured for about 6 months. He states it is  after pickleball, the shoulder will hurt and sometimes even w/o playing. Pt denies popping clicking catching into the shoulder. Pt denies NT and cancer red flags.    Diagnostic tests [place]- CMC joint arthritis and mild edema    Patient Stated Goals return to full normal physical activity without ROM limitation or pain    Currently in Pain? No/denies    Pain Score 3     Pain Orientation Right    Pain Descriptors / Indicators Aching    Pain Type Chronic pain    Pain Onset More than a month ago    Pain Frequency Intermittent    Aggravating Factors  no patttern    Pain Relieving Factors no pattern    Effect of Pain on Daily Activities difficulty with grasping, pain at rest    Multiple Pain Sites Yes    Pain Score 2    Pain Location Shoulder    Pain Orientation Right;Lateral    Pain Descriptors / Indicators Aching;Dull    Pain Type Chronic pain    Pain Onset More than a month ago    Pain Frequency Intermittent    Aggravating Factors  no pattern    Pain Relieving Factors no pattern  [facility] PT Assessment - 09/14/20 0001      Assessment   Medical Diagnosis R wrist joint stiffness    Referring Provider (PT) Dr. Jerline Pain    Hand Dominance Right      Precautions   Precautions None      Restrictions   Weight Bearing Restrictions No      Balance Screen   Has the patient fallen in the past 6 months No    Has the patient had a decrease in activity level because of a fear of falling?  No    Is the patient reluctant to leave their home because of a fear of falling?  No      Home Ecologist residence      Prior Function   Level of Independence Independent      Cognition   Overall Cognitive Status Within Functional Limits for tasks assessed      Observation/Other Assessments   Other Surveys  Quick Dash    Quick DASH  5%      Coordination   Fine Motor Movements are Fluid and Coordinated Yes      ROM / Strength   AROM / PROM / Strength  AROM;PROM;Strength      AROM   Overall AROM Comments C/S, shoulder, and elbow WNL, R wrist extension limited 15 deg difference from L into extension, L flexion and extension WNL      PROM   Overall PROM Comments Decreased R wrist extension, decreased carpal palmar glide      Strength   Overall Strength Within functional limits for tasks performed    Overall Strength Comments Wrist WNL; R shoulder 4/5 flexion and ABD, L shoulder 4+/5 throughout      Palpation   Palpation comment Crepitus with R carpals, increased wrist and finger flexor tone      Special Tests   Other special tests (-) Ballotment (-) TFCC grind (+) thumb CMC scour          Quadruped wrist extension- unable to reach full elbow extension, improved with heel prop. Able to reach full extension after manual therapy and joint mobilization            Objective measurements completed on examination: See above findings.       [facility] Adult PT Treatment/Exercise - 09/14/20 0001      Exercises   Exercises Wrist      Wrist Exercises   Other wrist exercises Wrist flexor stretch 30s 3x (elbow flexed and extended); self wrist extension mob 3 min      Manual Therapy   Manual Therapy Joint mobilization    Joint Mobilization grade III wrist extension mob with extension bias; LAD grade III R [facility]                  PT Education - 09/14/20 1940    Education Details MOI, anatomy, biomechanics, positioning, diagnosis, prognosis, HEP, POC    Person(s) Educated Patient    Methods Explanation;Demonstration;Handout    Comprehension Verbalized understanding;Returned demonstration            PT Short Term Goals - 09/14/20 2035      PT SHORT TERM GOAL #1   Title Pt will become independent with HEP in order to demonstrate synthesis of PT education.    Time 2    Period Weeks    Status New      PT SHORT TERM GOAL #2   Title Pt will be able  to demonstrate full elbow extension in quadruped in order to  demonstrate functional improvement in UE function for full return to exercise without pain.    Time 4    Period Weeks             PT Long Term Goals - 09/14/20 2036      PT LONG TERM GOAL #1   Title Pt will be able to maintain tall plank and downward dog positions without pain and full wrist ROM in order to demonstrate functional improvement in R wrist function for full return to physical fitness and exercise.    Time 6    Period Weeks    Status New      PT LONG TERM GOAL #2   Title Pt will be able to lift and carry 25lbs OH secondary to no shoulder or thumb pain in order to demonstrate functional improvement in R UE function for return to regular physical exercise.    Time 6    Period Weeks                  Plan - 09/14/20 2030    Clinical Impression Statement Pt is a 70 y.o. male presenting to PT for CC of R wrist/thumb pain and R shoulder pain. Pt' presents with decreased R wrist ROM as well as moderate R UE weakness. Pt's s/s are consistent with R wrist and thumb joint stiffness likely from history of OA. Pt's pain presentation is inconsistent but minimally irritable and sensitive. PT was not able to recreate pain at eval but pt did have elbow extension limitation in CKC position due to decreased wrist extension ROM. Pt's current impairments limit his physical exercise and ADL. Pt would benefit from continued skilled therapy in order to maximize R UE function to prevent further decline and reach goals for return to PLOF.    Personal Factors and Comorbidities Age;Time since onset of injury/illness/exacerbation;Profession    Examination-Activity Limitations Lift;Carry;Other    Examination-Participation Restrictions Community Activity;Other    Stability/Clinical Decision Making Stable/Uncomplicated    Clinical Decision Making Low    PT Frequency 1x / week    PT Duration 6 weeks    PT Treatment/Interventions ADLs/Self Care Home Management;Biofeedback;Electrical  Stimulation;Cryotherapy;Iontophoresis 4mg /ml Dexamethasone;Moist Heat;Therapeutic activities;Therapeutic exercise;Neuromuscular re-education;Patient/family education;Manual techniques;Passive range of motion;Dry needling;Taping;Joint Manipulations;Spinal Manipulations    PT Next Visit Plan review HEP, joint mobs, self banded joint mob, wrist extension and flexion with weight    PT Home Exercise Plan [email or web address]    Consulted and Agree with Plan of Care Patient           Patient will benefit from skilled therapeutic intervention in order to improve the following deficits and impairments:  Hypomobility,Increased muscle spasms,Pain,Improper body mechanics,Impaired flexibility,Decreased strength,Decreased range of motion,Impaired UE functional use  Visit Diagnosis: Stiffness of right wrist, not elsewhere classified  Pain in right upper arm  Muscle weakness (generalized)     Problem List Patient Active Problem List   Diagnosis Date Noted  . Syncope 09/08/2020  . Impingement syndrome of right wrist 02/25/2020  . Elevated blood pressure reading 09/08/2019  . Dyslipidemia 11/24/2018   Daleen Bo PT, DPT 09/14/20 8:46 PM   [facility] [address] [place], [place], [address] Phone: [phone]   Fax:  [phone]  Name: Levonte Molina MRN: [id number] Date of Birth: [birth year]

## 2020-09-18 ENCOUNTER — Other Ambulatory Visit: Payer: Self-pay

## 2020-09-18 ENCOUNTER — Encounter: Payer: Self-pay | Admitting: Physical Therapy

## 2020-09-18 ENCOUNTER — Ambulatory Visit (INDEPENDENT_AMBULATORY_CARE_PROVIDER_SITE_OTHER): Payer: Medicare Other | Admitting: Physical Therapy

## 2020-09-18 DIAGNOSIS — M25631 Stiffness of right wrist, not elsewhere classified: Secondary | ICD-10-CM | POA: Diagnosis not present

## 2020-09-18 DIAGNOSIS — M6281 Muscle weakness (generalized): Secondary | ICD-10-CM | POA: Diagnosis not present

## 2020-09-18 DIAGNOSIS — M79621 Pain in right upper arm: Secondary | ICD-10-CM

## 2020-09-18 NOTE — Therapy (Signed)
Jacob Hood [address] [place], [place], [address] Phone: [phone]   Fax:  [phone]  Physical Therapy Treatment  Patient Details  Name: Jacob Hood MRN: [id number] Date of Birth: 1950/08/18 Referring Provider (PT): Dr. Jerline Pain   Encounter Date: 09/18/2020   PT End of Session - 09/18/20 1329    Visit Number 2    Number of Visits 7    Date for PT Re-Evaluation 11/02/20    Authorization Type Medicare A and B    PT Start Time 6283    PT Stop Time 1342    PT Time Calculation (min) 39 min    Activity Tolerance Patient tolerated treatment well    Behavior During Therapy [facility] for tasks assessed/performed           Past Medical History:  Diagnosis Date  . Hyperlipidemia     Past Surgical History:  Procedure Laterality Date  . TONSILLECTOMY      There were no vitals filed for this visit.   Subjective Assessment - 09/18/20 1306    Subjective Pt states the wrist is beginning to move better during yoga but did have some R wrist/thumb pain with brushing his teeth this morning.    Diagnostic tests [place]- CMC joint arthritis and mild edema    Patient Stated Goals return to full normal physical activity without ROM limitation or pain    Currently in Pain? No/denies    Pain Score 0-No pain    Pain Onset More than a month ago    Pain Onset More than a month ago                             [facility] Adult PT Treatment/Exercise - 09/18/20 0001      Exercises   Exercises Wrist      Wrist Exercises   Other wrist exercises Wrist flexor stretch 30s 3x (elbow flexed and extended); self wrist extension mob 5 min; wall push up 2x10   Other wrist exercises wrist extension 2lb DB      Manual Therapy   Manual Therapy Joint mobilization    Manual therapy comments STM FCU R    Joint Mobilization grade III wrist extension mob with extension bias; LAD grade III R CMC, CMC AP mob grade III                  PT  Education - 09/18/20 1346    Education Details anatomy, biomechanics, positioning, HEP update    Person(s) Educated Patient    Methods Explanation;Demonstration;Tactile cues;Verbal cues    Comprehension Verbalized understanding;Returned demonstration            PT Short Term Goals - 09/14/20 2035      PT SHORT TERM GOAL #1   Title Pt will become independent with HEP in order to demonstrate synthesis of PT education.    Time 2    Period Weeks    Status New      PT SHORT TERM GOAL #2   Title Pt will be able to demonstrate full elbow extension in quadruped in order to demonstrate functional improvement in UE function for full return to exercise without pain.    Time 4    Period Weeks             PT Long Term Goals - 09/14/20 2036      PT LONG TERM GOAL #1   Title Pt will be  able to maintain tall plank and downward dog positions without pain and full wrist ROM in order to demonstrate functional improvement in R wrist function for full return to physical fitness and exercise.    Time 6    Period Weeks    Status New      PT LONG TERM GOAL #2   Title Pt will be able to lift and carry 25lbs OH secondary to no shoulder or thumb pain in order to demonstrate functional improvement in R UE function for return to regular physical exercise.    Time 6    Period Weeks                 Plan - 09/18/20 1330    Clinical Impression Statement Pt demonstrated improved wrist extension ROM following joint mobilization and STM. Pt required incraased cuing with HEP and performing self wrist mobilization technique as well as edu regarding body mechancis for self mob. Pt was able to progress to wrist extension exercise to begin creating increased wrist ROM for CKC yoga positions. Pt found no pain with new wall push up exercise but does demonstrate decrease wrist extension when going into elbow wextension. Pt would benefit from continued skilled therapy in order to maximize R UE function to  prevent further decline and reach goals for return to PLOF.e    Personal Factors and Comorbidities Age;Time since onset of injury/illness/exacerbation;Profession    Examination-Activity Limitations Lift;Carry;Other    Examination-Participation Restrictions Community Activity;Other    Stability/Clinical Decision Making Stable/Uncomplicated    PT Frequency 1x / week    PT Duration 6 weeks    PT Treatment/Interventions ADLs/Self Care Home Management;Biofeedback;Electrical Stimulation;Cryotherapy;Iontophoresis 4mg /ml Dexamethasone;Moist Heat;Therapeutic activities;Therapeutic exercise;Neuromuscular re-education;Patient/family education;Manual techniques;Passive range of motion;Dry needling;Taping;Joint Manipulations;Spinal Manipulations    PT Next Visit Plan review HEP, joint mobs, self banded joint mob, wrist extension and flexion with weight    PT Home Exercise Plan [email or web address]    Consulted and Agree with Plan of Care Patient           Patient will benefit from skilled therapeutic intervention in order to improve the following deficits and impairments:  Hypomobility,Increased muscle spasms,Pain,Improper body mechanics,Impaired flexibility,Decreased strength,Decreased range of motion,Impaired UE functional use  Visit Diagnosis: Stiffness of right wrist, not elsewhere classified  Pain in right upper arm  Muscle weakness (generalized)     Problem List Patient Active Problem List   Diagnosis Date Noted  . Syncope 09/08/2020  . Impingement syndrome of right wrist 02/25/2020  . Elevated blood pressure reading 09/08/2019  . Dyslipidemia 11/24/2018    Daleen Bo PT, DPT 09/18/20 1:48 PM   [facility] [address] [place], [place], [address] Phone: [phone]   Fax:  [phone]  Name: Jacob Hood MRN: [id number] Date of Birth: Aug 19, 1950

## 2020-09-18 NOTE — Patient Instructions (Signed)
Access Code: [email or web address] URL: [email or web address] Date: 09/18/2020 Prepared by: Daleen Bo  Exercises Seated Wrist Extension Stretch - 2 x daily - 7 x weekly - 1 sets - 3 reps - 30 hold Seated Wrist Extension PROM - 2 x daily - 7 x weekly - 1 sets - 1 reps - 6min hold Wall Push Up - 1 x daily - 7 x weekly - 2 sets - 10 reps

## 2020-09-20 ENCOUNTER — Ambulatory Visit (INDEPENDENT_AMBULATORY_CARE_PROVIDER_SITE_OTHER): Payer: Medicare Other | Admitting: Physical Therapy

## 2020-09-20 ENCOUNTER — Other Ambulatory Visit: Payer: Self-pay

## 2020-09-20 ENCOUNTER — Encounter: Payer: Self-pay | Admitting: Physical Therapy

## 2020-09-20 DIAGNOSIS — M6281 Muscle weakness (generalized): Secondary | ICD-10-CM

## 2020-09-20 DIAGNOSIS — M25631 Stiffness of right wrist, not elsewhere classified: Secondary | ICD-10-CM | POA: Diagnosis not present

## 2020-09-20 DIAGNOSIS — M79621 Pain in right upper arm: Secondary | ICD-10-CM

## 2020-09-20 NOTE — Therapy (Signed)
[facility] [address] [place], [place], [address] Phone: [phone]   Fax:  [phone]  Physical Therapy Treatment  Patient Details  Name: Jacob Hood MRN: [id number] Date of Birth: 1950-12-23 Referring Provider (PT): Dr. Jerline Pain   Encounter Date: 09/20/2020   PT End of Session - 09/20/20 1649    Visit Number 3    Number of Visits 7    Date for PT Re-Evaluation 11/02/20    Authorization Type Medicare A and B    PT Start Time 1602    PT Stop Time 1641    PT Time Calculation (min) 39 min    Activity Tolerance Patient tolerated treatment well    Behavior During Therapy [facility] for tasks assessed/performed           Past Medical History:  Diagnosis Date  . Hyperlipidemia     Past Surgical History:  Procedure Laterality Date  . TONSILLECTOMY      There were no vitals filed for this visit.   Subjective Assessment - 09/20/20 1607    Subjective Pt states that the R wrist is doing well. He has had no pain since last time session. He has not had that pain with teeth brushing like last time. He does notice that his elbow is able to lock better.    Diagnostic tests [place]- CMC joint arthritis and mild edema    Patient Stated Goals return to full normal physical activity without ROM limitation or pain    Currently in Pain? No/denies    Pain Score 0-No pain    Pain Onset More than a month ago    Pain Onset More than a month ago                             [facility] Adult PT Treatment/Exercise - 09/20/20 0001      Exercises   Exercises Wrist      Wrist Exercises   Other wrist exercises Wrist flexor stretch 30s 3x (elbow flexed and extended); self wrist extension mob 3 min; wall push up 10x; quadruped kneeling self wrist extension mob blue TB 20x 5s    Other wrist exercises wrist extension 2lb DB      Manual Therapy   Manual Therapy Joint mobilization    Joint Mobilization grade III wrist extension mob with  extension bias; LAD grade III R CMC, CMC AP mob grade III                  PT Education - 09/20/20 1645    Education Details anatomy, biomechanics, positioning, HEP update, exercise progression, POC    Person(s) Educated Patient    Methods Explanation;Demonstration;Tactile cues;Verbal cues    Comprehension Verbalized understanding;Returned demonstration            PT Short Term Goals - 09/14/20 2035      PT SHORT TERM GOAL #1   Title Pt will become independent with HEP in order to demonstrate synthesis of PT education.    Time 2    Period Weeks    Status New      PT SHORT TERM GOAL #2   Title Pt will be able to demonstrate full elbow extension in quadruped in order to demonstrate functional improvement in UE function for full return to exercise without pain.    Time 4    Period Weeks             PT  Long Term Goals - 09/14/20 2036      PT LONG TERM GOAL #1   Title Pt will be able to maintain tall plank and downward dog positions without pain and full wrist ROM in order to demonstrate functional improvement in R wrist function for full return to physical fitness and exercise.    Time 6    Period Weeks    Status New      PT LONG TERM GOAL #2   Title Pt will be able to lift and carry 25lbs OH secondary to no shoulder or thumb pain in order to demonstrate functional improvement in R UE function for return to regular physical exercise.    Time 6    Period Weeks                 Plan - 09/20/20 1649    Clinical Impression Statement Pt demonstrated improved wrist extension ROM following joint mobilization and self mobilization exercise. Pt is able to perform HEP with minimal cuing for position and form at this time to improve wrist ROM. Pt is not pain limited at this time. Pt to decrease frequency of visits. Pt would benefit from continued skilled therapy in order to maximize R UE function to prevent further decline and reach goals for return to PLOF    Personal  Factors and Comorbidities Age;Time since onset of injury/illness/exacerbation;Profession    Examination-Activity Limitations Lift;Carry;Other    Examination-Participation Restrictions Community Activity;Other    Stability/Clinical Decision Making Stable/Uncomplicated    PT Frequency 1x / week    PT Duration 6 weeks    PT Treatment/Interventions ADLs/Self Care Home Management;Biofeedback;Electrical Stimulation;Cryotherapy;Iontophoresis 4mg /ml Dexamethasone;Moist Heat;Therapeutic activities;Therapeutic exercise;Neuromuscular re-education;Patient/family education;Manual techniques;Passive range of motion;Dry needling;Taping;Joint Manipulations;Spinal Manipulations    PT Next Visit Plan review HEP, joint mobs, self banded joint mob, wrist extension and flexion with weight    PT Home Exercise Plan [email or web address]    Consulted and Agree with Plan of Care Patient           Patient will benefit from skilled therapeutic intervention in order to improve the following deficits and impairments:  Hypomobility,Increased muscle spasms,Pain,Improper body mechanics,Impaired flexibility,Decreased strength,Decreased range of motion,Impaired UE functional use  Visit Diagnosis: Stiffness of right wrist, not elsewhere classified  Pain in right upper arm  Muscle weakness (generalized)     Problem List Patient Active Problem List   Diagnosis Date Noted  . Syncope 09/08/2020  . Impingement syndrome of right wrist 02/25/2020  . Elevated blood pressure reading 09/08/2019  . Dyslipidemia 11/24/2018    Daleen Bo PT, DPT 09/20/20 6:01 PM   [facility] [address] [place], [place], [address] Phone: [phone]   Fax:  [phone]  Name: Jacob Hood MRN: [id number] Date of Birth: Nov 21, 1950

## 2020-09-20 NOTE — Patient Instructions (Signed)
Access Code: [email or web address] URL: [email or web address] Date: 09/20/2020 Prepared by: Daleen Bo  Exercises Seated Wrist Extension Stretch - 2 x daily - 7 x weekly - 1 sets - 3 reps - 30 hold Seated Wrist Extension PROM - 2 x daily - 7 x weekly - 1 sets - 1 reps - 52min hold Wall Push Up - 1 x daily - 7 x weekly - 2 sets - 10 reps Seated Wrist Extension with Dumbbell - 1 x daily - 7 x weekly - 3 sets - 10 reps

## 2020-10-05 ENCOUNTER — Encounter: Payer: Medicare Other | Admitting: Physical Therapy

## 2020-10-11 ENCOUNTER — Other Ambulatory Visit: Payer: Self-pay

## 2020-10-11 ENCOUNTER — Encounter: Payer: Self-pay | Admitting: Physical Therapy

## 2020-10-11 ENCOUNTER — Ambulatory Visit (INDEPENDENT_AMBULATORY_CARE_PROVIDER_SITE_OTHER): Payer: Medicare Other | Admitting: Physical Therapy

## 2020-10-11 DIAGNOSIS — M6281 Muscle weakness (generalized): Secondary | ICD-10-CM

## 2020-10-11 DIAGNOSIS — M25631 Stiffness of right wrist, not elsewhere classified: Secondary | ICD-10-CM | POA: Diagnosis not present

## 2020-10-11 DIAGNOSIS — M79621 Pain in right upper arm: Secondary | ICD-10-CM

## 2020-10-11 NOTE — Therapy (Addendum)
[facility] [address] [place], [place], [address] Phone: [phone]   Fax:  [phone]  Physical Therapy Progress Note  Patient Details  Name: Jacob Hood MRN: [id number] Date of Birth: 1951-04-25 Referring Provider (PT): Dr. Jerline Pain   Encounter Date: 10/11/2020   PT End of Session - 10/11/20 1309    Visit Number 4    Number of Visits 7    Date for PT Re-Evaluation 11/02/20    Authorization Type Medicare Jacob and B    PT Start Time 1020    PT Stop Time 1059    PT Time Calculation (min) 39 min    Activity Tolerance Patient tolerated treatment well    Behavior During Therapy [facility] for tasks assessed/performed           Past Medical History:  Diagnosis Date  . Hyperlipidemia     Past Surgical History:  Procedure Laterality Date  . TONSILLECTOMY      There were no vitals filed for this visit.   Subjective Assessment - 10/11/20 1025    Subjective Pt states that the progress is "slow but sure." He feels that three is slow progress being made. Occasionally, there will be "discomfort" in the R thumb but it goes Jacob way fairly quickly.    Diagnostic tests [place]- CMC joint arthritis and mild edema    Patient Stated Goals return to full normal physical activity without ROM limitation or pain    Currently in Pain? No/denies    Pain Score 0-No pain    Pain Onset More than Jacob month ago    Pain Onset More than Jacob month ago              [facility] PT Assessment - 10/11/20 0001      Assessment   Medical Diagnosis R wrist joint stiffness    Referring Provider (PT) Dr. Jerline Pain    Hand Dominance Right      Precautions   Precautions None      Restrictions   Weight Bearing Restrictions No      Balance Screen   Has the patient fallen in the past 6 months No      [place] residence      Prior Function   Level of Independence Independent      Cognition   Overall Cognitive Status Within  Functional Limits for tasks assessed                   Coordination   Fine Motor Movements are Fluid and Coordinated Yes      AROM   Overall AROM Comments C/S, shoulder, and elbow WNL, R wrist extension 57 deg, L wrist flexion and extension WNL      PROM   Overall PROM Comments Decreased R wrist extension, decreased carpal palmar glide as compared to L, crepitus noted      Strength   Overall Strength Within functional limits for tasks performed    Overall Strength Comments Wrist WNL      Palpation   Palpation comment Crepitus with R carpals      Special Tests   Other special tests --                         [facility] Adult PT Treatment/Exercise - 10/11/20 0001      Exercises   Exercises Wrist      Wrist Exercises   Other wrist exercises  Wrist flexor stretch 30s 3x (elbow flexed and extended); counter push up 10x; quadruped kneeling self wrist extension mob blue TB 20x 5s 3 way angle    Other wrist exercises wrist extension 2lb DB      Manual Therapy   Manual Therapy Joint mobilization    Joint Mobilization grade III wrist extension mob with extension bias; LAD grade III R CMC, CMC AP mob grade III                  PT Education - 10/11/20 1310    Education Details anatomy, HEP update, exercise progression, POC, heel lift during yoga, exam finding    Person(s) Educated Patient    Methods Explanation;Demonstration;Tactile cues;Verbal cues    Comprehension Verbalized understanding;Returned demonstration            PT Short Term Goals - 10/11/20 1315      PT SHORT TERM GOAL #1   Title Pt will become independent with HEP in order to demonstrate synthesis of PT education.    Time 2    Period Weeks    Status Achieved      PT SHORT TERM GOAL #2   Title Pt will be able to demonstrate full elbow extension in quadruped in order to demonstrate functional improvement in UE function for full return to exercise without pain.    Time 4    Period Weeks     Status Achieved             PT Long Term Goals - 10/11/20 1315      PT LONG TERM GOAL #1   Title Pt will be able to maintain tall plank and downward dog positions without pain and full wrist ROM in order to demonstrate functional improvement in R wrist function for full return to physical fitness and exercise.    Time 6    Period Weeks    Status Partially Met      PT LONG TERM GOAL #2   Title Pt will be able to lift and carry 25lbs OH secondary to no shoulder or thumb pain in order to demonstrate functional improvement in R UE function for return to regular physical exercise.    Time 6    Period Weeks    Status Partially Met                 Plan - 10/11/20 1310    Clinical Impression Statement Pt demonstrates improvement in Jacob/PROM in R wrist extension ROM and joint mobility since intial evaluation. Pt has increased soft tissue extensbility as well as increased R wrist AROM. Pt only notes R thumb pain since last session. Pt's progress is slow but he is finding better ability to achieve full end range elbow extension during CKC plank position (with compensation). Pt gave verbal understanding to education provided re compensations to be used during yoga to assist in reaching plank position and to avoid pain. Pt may nearing functional plateau given arthritic changes within the wrist and CMC joint, but will continue to progress towards full reutrn to function as tolerated. Pt would benefit from continued skilled therapy in order to reach goals and maximize functional R wrist ROM and strength for prevention of further functional decline.    Personal Factors and Comorbidities Age;Time since onset of injury/illness/exacerbation;Profession    Examination-Activity Limitations Lift;Carry;Other    Examination-Participation Restrictions Community Activity;Other    Stability/Clinical Decision Making Stable/Uncomplicated    PT Frequency 1x / week    PT  Duration 6 weeks    PT  Treatment/Interventions ADLs/Self Care Home Management;Biofeedback;Electrical Stimulation;Cryotherapy;Iontophoresis 56m/ml Dexamethasone;Moist Heat;Therapeutic activities;Therapeutic exercise;Neuromuscular re-education;Patient/family education;Manual techniques;Passive range of motion;Dry needling;Taping;Joint Manipulations;Spinal Manipulations    PT Next Visit Plan review HEP    PT Home Exercise Plan [email or web address]    Consulted and Agree with Plan of Care Patient           Patient will benefit from skilled therapeutic intervention in order to improve the following deficits and impairments:  Hypomobility,Increased muscle spasms,Pain,Improper body mechanics,Impaired flexibility,Decreased strength,Decreased range of motion,Impaired UE functional use  Visit Diagnosis: Stiffness of right wrist, not elsewhere classified  Pain in right upper arm  Muscle weakness (generalized)     Problem List Patient Active Problem List   Diagnosis Date Noted  . Syncope 09/08/2020  . Impingement syndrome of right wrist 02/25/2020  . Elevated blood pressure reading 09/08/2019  . Dyslipidemia 11/24/2018    ADaleen BoPT, DPT 10/11/20 1:17 PM   [place] [address]Phone: [phone]  Fax:  [phone] Name: AKiyan BurmesterMRN: [id number] of Birth: 6Nov 04, 1952

## 2020-10-14 DIAGNOSIS — Z23 Encounter for immunization: Secondary | ICD-10-CM | POA: Diagnosis not present

## 2020-10-24 ENCOUNTER — Encounter: Payer: Self-pay | Admitting: Physical Therapy

## 2020-10-24 ENCOUNTER — Other Ambulatory Visit: Payer: Self-pay

## 2020-10-24 ENCOUNTER — Ambulatory Visit (INDEPENDENT_AMBULATORY_CARE_PROVIDER_SITE_OTHER): Payer: Medicare Other | Admitting: Physical Therapy

## 2020-10-24 DIAGNOSIS — M25631 Stiffness of right wrist, not elsewhere classified: Secondary | ICD-10-CM

## 2020-10-24 DIAGNOSIS — M6281 Muscle weakness (generalized): Secondary | ICD-10-CM

## 2020-10-24 DIAGNOSIS — M79621 Pain in right upper arm: Secondary | ICD-10-CM

## 2020-10-24 NOTE — Patient Instructions (Signed)
Access Code: [email or web address] URL: [email or web address] Date: 10/24/2020 Prepared by: Daleen Bo  Exercises Seated Wrist Extension Stretch - 2 x daily - 7 x weekly - 1 sets - 3 reps - 30 hold Seated Wrist Extension PROM - 2 x daily - 7 x weekly - 1 sets - 1 reps - 66min hold Wall Push Up - 1 x daily - 7 x weekly - 2 sets - 10 reps Seated Wrist Flexion with Dumbbell - 1 x daily - 7 x weekly - 3 sets - 10 reps Seated Wrist Extension with Dumbbell - 1 x daily - 7 x weekly - 3 sets - 10 reps Wrist Prayer Stretch - 2 x daily - 7 x weekly - 1 sets - 3 reps - 30 hold

## 2020-10-24 NOTE — Therapy (Addendum)
[facility] [address] [place], [place], [address] Phone: [phone]   Fax:  [phone]  Physical Therapy Discharge  Patient Details  Name: Jacob Hood MRN: [id number] Date of Birth: 1951-04-21 Referring Provider (PT): Dr. Jerline Pain   Encounter Date: 10/24/2020   PT End of Session - 10/24/20 1113    Visit Number 5    Number of Visits 7    Date for PT Re-Evaluation 11/02/20    Authorization Type Medicare A and B    PT Start Time 5465    PT Stop Time 1050    PT Time Calculation (min) 35 min    Activity Tolerance Patient tolerated treatment well    Behavior During Therapy [facility] for tasks assessed/performed           Past Medical History:  Diagnosis Date  . Hyperlipidemia     Past Surgical History:  Procedure Laterality Date  . TONSILLECTOMY      There were no vitals filed for this visit.   Subjective Assessment - 10/24/20 1048    Subjective Pt states he is continuing to make progress. He feels the lockout is still getting better in his tall plank position in yoga and the exercises are helping. He has had no pain since last session. He still has mild level discomfort in the thumb with some movements but it does not last. Pt states he feels he is independent with management of his R wrist rehab.    Diagnostic tests [place]- CMC joint arthritis and mild edema    Patient Stated Goals return to full normal physical activity without ROM limitation or pain    Currently in Pain? No/denies    Pain Onset More than a month ago    Pain Onset More than a month ago              [facility] PT Assessment - 10/24/20 0001      Assessment   Medical Diagnosis R wrist joint stiffness    Referring Provider (PT) Dr. Jerline Pain    Hand Dominance Right      Precautions   Precautions None      Restrictions   Weight Bearing Restrictions No      Balance Screen   Has the patient fallen in the past 6 months No      [place] residence      Prior Function   Level of Independence Independent      Cognition   Overall Cognitive Status Within Functional Limits for tasks assessed      Coordination   Fine Motor Movements are Fluid and Coordinated Yes      AROM   Overall AROM Comments R wrist extension 79deg; flexion 55 deg      PROM   Overall PROM Comments no pain with ext overpressure      Strength   Overall Strength Within functional limits for tasks performed    Overall Strength Comments Wrist WNL      Palpation   Palpation comment --          Outcome Measure: Quick DASH 0% disability, Recreation/Sport Module 25% limitation               [facility] Adult PT Treatment/Exercise - 10/24/20 0001      Exercises   Exercises Wrist      Wrist Exercises   Other wrist exercises Wrist flexor stretch 30s 3x (elbow flexed and extended); wrist prayer stretch 3x10  5lbs, wrist flexion 5lbs 2x10    Other wrist exercises Review of HEP  wrist extension 5lb DB  Increase in intensity, progression of exercise, self management                  PT Education - 10/24/20 1050    Education Details anatomy, HEP update, self exercise progression, POC, heel lift during yoga, D/C plan    Person(s) Educated Patient    Methods Explanation;Demonstration;Tactile cues;Verbal cues;Handout    Comprehension Verbalized understanding;Returned demonstration;Verbal cues required;Tactile cues required            PT Short Term Goals - 10/11/20 1315      PT SHORT TERM GOAL #1   Title Pt will become independent with HEP in order to demonstrate synthesis of PT education.    Time 2    Period Weeks    Status Achieved      PT SHORT TERM GOAL #2   Title Pt will be able to demonstrate full elbow extension in quadruped in order to demonstrate functional improvement in UE function for full return to exercise without pain.    Time 4    Period Weeks    Status Achieved             PT Long Term  Goals - 10/24/20 1131      PT LONG TERM GOAL #1   Title Pt will be able to maintain tall plank and downward dog positions without pain and full wrist ROM in order to demonstrate functional improvement in R wrist function for full return to physical fitness and exercise.    Time 6    Period Weeks    Status Achieved      PT LONG TERM GOAL #2   Title Pt will be able to lift and carry 25lbs OH secondary to no shoulder or thumb pain in order to demonstrate functional improvement in R UE function for return to regular physical exercise.    Time 6    Period Weeks    Status Achieved                 Plan - 10/24/20 1113    Clinical Impression Statement Pt demonstrates signficant improvement with quantative as well as qualitative wrist extension as demonstrated by objective and outcome measures. Pt has reached all PT goals and has a good understanding of self exercise progression. D/C this episode of care. No further needs for skilled therapy.    Personal Factors and Comorbidities Age;Time since onset of injury/illness/exacerbation;Profession    Examination-Activity Limitations Lift;Carry;Other    Examination-Participation Restrictions Community Activity;Other    Stability/Clinical Decision Making Stable/Uncomplicated    PT Frequency 1x / week    PT Duration 6 weeks    PT Treatment/Interventions ADLs/Self Care Home Management;Biofeedback;Electrical Stimulation;Cryotherapy;Iontophoresis 19m/ml Dexamethasone;Moist Heat;Therapeutic activities;Therapeutic exercise;Neuromuscular re-education;Patient/family education;Manual techniques;Passive range of motion;Dry needling;Taping;Joint Manipulations;Spinal Manipulations    Consulted and Agree with Plan of Care Patient           Patient will benefit from skilled therapeutic intervention in order to improve the following deficits and impairments:  Hypomobility,Increased muscle spasms,Pain,Improper body mechanics,Impaired flexibility,Decreased  strength,Decreased range of motion,Impaired UE functional use  Visit Diagnosis: Stiffness of right wrist, not elsewhere classified  Pain in right upper arm  Muscle weakness (generalized)     Problem List Patient Active Problem List   Diagnosis Date Noted  . Syncope 09/08/2020  . Impingement syndrome of right wrist 02/25/2020  . Elevated blood pressure reading 09/08/2019  .  Dyslipidemia 11/24/2018    Daleen Bo PT, DPT 10/24/20 11:33 AM   [facility] [address] [place], [place], [address] Phone: [phone]   Fax:  [phone]  Name: Jacob Hood MRN: [id number] Date of Birth: 03-Aug-1950  PHYSICAL THERAPY DISCHARGE SUMMARY  Visits from Start of Care: 5 Plan: Patient agrees to discharge.  Patient goals were met. Patient is being discharged due to meeting the stated rehab goals.  ?????

## 2020-11-10 DIAGNOSIS — L281 Prurigo nodularis: Secondary | ICD-10-CM | POA: Diagnosis not present

## 2020-11-10 DIAGNOSIS — D1801 Hemangioma of skin and subcutaneous tissue: Secondary | ICD-10-CM | POA: Diagnosis not present

## 2020-11-10 DIAGNOSIS — D225 Melanocytic nevi of trunk: Secondary | ICD-10-CM | POA: Diagnosis not present

## 2020-11-10 DIAGNOSIS — L821 Other seborrheic keratosis: Secondary | ICD-10-CM | POA: Diagnosis not present

## 2020-11-10 DIAGNOSIS — L814 Other melanin hyperpigmentation: Secondary | ICD-10-CM | POA: Diagnosis not present

## 2021-02-07 ENCOUNTER — Other Ambulatory Visit: Payer: Self-pay

## 2021-02-07 ENCOUNTER — Ambulatory Visit (INDEPENDENT_AMBULATORY_CARE_PROVIDER_SITE_OTHER): Payer: Medicare Other

## 2021-02-07 ENCOUNTER — Ambulatory Visit (INDEPENDENT_AMBULATORY_CARE_PROVIDER_SITE_OTHER): Payer: Medicare Other | Admitting: Family Medicine

## 2021-02-07 ENCOUNTER — Ambulatory Visit: Payer: Self-pay

## 2021-02-07 VITALS — BP 118/80 | HR 68 | Ht 68.0 in | Wt 154.6 lb

## 2021-02-07 DIAGNOSIS — M79672 Pain in left foot: Secondary | ICD-10-CM | POA: Diagnosis not present

## 2021-02-07 NOTE — Progress Notes (Signed)
I, Peterson Lombard, LAT, ATC acting as a scribe for Lynne Leader, MD.  Jacob Hood is a 70 y.o. male who presents to [facility] today for L foot pain that he injured this morning playing pickleball. Pt was previously seen by Dr. Georgina Snell on 02/25/20 for R wrist and thumb pain. Today, pt reports MOI: Pt went to turn around to get a lob and felt a slight INV and then a "pop" on the plantar aspect of his L foot. Pt locates pain to medial arch of his L foot.  L foot swelling: no Aggravates: walking esp up/down stairs Treatments tried: none   Pertinent review of systems: No fevers or chills  Relevant historical information: Dyslipidemia   Exam:  BP 118/80   Pulse 68   Ht '5\' 8"'$  (1.727 m)   Wt 154 lb 9.6 oz (70.1 kg)   SpO2 98%   BMI 23.51 kg/m  General: Well Developed, well nourished, and in no acute distress.   MSK: Left foot normal. Normal foot and ankle motion. Tender palpation plantar medial midfoot mildly. Pain mildly with resisted toe flexion 2-5.  No pain with resisted great toe flexion. No pain with resisted foot plantarflexion and dorsiflexion inversion or eversion. Pulses cap refill and sensation are intact distally.    Lab and Radiology Results  Diagnostic Limited MSK Ultrasound of: Foot plantar medial midfoot Normal-appearing posterior tibialis tendon along medial ankle and into midfoot. Normal plantar fascia. Normal plantar midfoot musculature. No avulsion fractures are visible.  No hypoechoic hematoma present. Impression: Normal-appearing plantar midfoot.   X-ray images left foot obtained today personally and independently interpreted No acute fractures visible and special focus on plantar midfoot.  Mild degenerative changes present. Awaiting a formal radiology review  Assessment and Plan: 70 y.o. male with injury to the left plantar midfoot.  Etiology is unclear.  Likely suffered a strain of the plantar musculature.  It is  possible he suffered a spring ligament strain as well.  X-ray and ultrasound are unrevealing however radiology over read of the x-ray is still pending.  Plan for home exercise program focused on intrinsic foot musculature and posterior tibialis tendon reviewed and taught in clinic today.  Additionally recommend Voltaren gel and arch straps and graduated return to exercise.  If not improving would proceed to MRI of the to further characterize potential cause for pain.   PDMP not reviewed this encounter. Orders Placed This Encounter  Procedures   [place] LIMITED JOINT SPACE STRUCTURES LOW LEFT(NO LINKED CHARGES)    Standing Status:   Future    Number of Occurrences:   1    Standing Expiration Date:   08/10/2021    Order Specific Question:   Reason for Exam (SYMPTOM  OR DIAGNOSIS REQUIRED)    Answer:   left foot pain    Order Specific Question:   Preferred imaging location?    Answer:   [facility]   DG Foot Complete Left    Standing Status:   Future    Number of Occurrences:   1    Standing Expiration Date:   02/07/2022    Order Specific Question:   Reason for Exam (SYMPTOM  OR DIAGNOSIS REQUIRED)    Answer:   left foot pain    Order Specific Question:   Preferred imaging location?    Answer:   [facility]   No orders of the defined types were placed in this encounter.    Discussed  warning signs or symptoms. Please see discharge instructions. Patient expresses understanding.   The above documentation has been reviewed and is accurate and complete Lynne Leader, M.D.

## 2021-02-07 NOTE — Patient Instructions (Addendum)
Thank you for coming in today.   Please get an Xray today before you leave   If your pain is not controlled enough use a post op shoe.   Start home exercises and use arch strap.   Please use Voltaren gel (Generic Diclofenac Gel) up to 4x daily for pain as needed.  This is available over-the-counter as both the name brand Voltaren gel and the generic diclofenac gel.   If not improving in 3-4 weeks let me know. I could get MRI.  If worsening let me know sooner.   Posterior Tibial Tendinitis Rehab Ask your health care provider which exercises are safe for you. Do exercises exactly as told by your health care provider and adjust them as directed. It is normal to feel mild stretching, pulling, tightness, or discomfort as you do these exercises. Stop right away if you feel sudden pain or your pain gets worse. Do not begin these exercises until told by your health care provider. Stretching and range-of-motion exercises These exercises warm up your muscles and joints and improve the movement and flexibility in your ankle and foot. These exercises may also help to relievepain. Standing wall calf stretch, knee straight  Stand with your hands against a wall. Extend your left / right leg behind you, and bend your front knee slightly. If directed, place a folded washcloth under the arch of your foot for support. Point the toes of your back foot slightly inward. Keeping your heels on the floor and your back knee straight, shift your weight toward the wall. Do not allow your back to arch. You should feel a gentle stretch in your upper left / right calf. Hold this position for __________ seconds. Repeat __________ times. Complete this exercise __________ times a day. Standing wall calf stretch, knee bent Stand with your hands against a wall. Extend your left / right leg behind you, and bend your front knee slightly. If directed, place a folded washcloth under the arch of your foot for support. Point the  toes of your back foot slightly inward. Unlock your back knee so it is bent. Keep your heels on the floor. You should feel a gentle stretch deep in your lower left / right calf. Hold this position for __________ seconds. Repeat __________ times. Complete this exercise __________ times a day. Strengthening exercises These exercises build strength and endurance in your ankle and foot. Enduranceis the ability to use your muscles for a long time, even after they get tired. Ankle inversion with band Secure one end of a rubber exercise band or tubing to a fixed object, such as a table leg or a pole, that will stay still when the band is pulled. Loop the other end of the band around the middle of your left / right foot. Sit on the floor facing the object with your left / right leg extended. The band or tube should be slightly tense when your foot is relaxed. Leading with your big toe, slowly bring your left / right foot and ankle inward, toward your other foot (inversion). Hold this position for __________ seconds. Slowly return your foot to the starting position. Repeat __________ times. Complete this exercise __________ times a day. Towel curls  Sit in a chair on a non-carpeted surface, and put your feet on the floor. Place a towel in front of your feet. If told by your health care provider, add a __________ weight to the end of the towel. Keeping your heel on the floor, put your left /  right foot on the towel. Pull the towel toward you by grabbing the towel with your toes and curling them under. Keep your heel on the floor while you do this. Let your toes relax. Grab the towel with your toes again. Keep going until the towel is completely underneath your foot. Repeat __________ times. Complete this exercise __________ times a day. Balance exercise This exercise improves or maintains your balance. Balance is important inpreventing falls. Single leg stand Without wearing shoes, stand near a  railing or in a doorway. You may hold on to the railing or door frame as needed for balance. Stand on your left / right foot. Keep your big toe down on the floor and try to keep your arch lifted. If balancing in this position is too easy, try the exercise with your eyes closed or while standing on a pillow. Hold this position for __________ seconds. Repeat __________ times. Complete this exercise __________ times a day. This information is not intended to replace advice given to you by your health care provider. Make sure you discuss any questions you have with your healthcare provider. Document Revised: 10/06/2018 Document Reviewed: 08/12/2018 Elsevier Patient Education  [place].

## 2021-02-08 ENCOUNTER — Telehealth: Payer: Self-pay | Admitting: Family Medicine

## 2021-02-08 NOTE — Telephone Encounter (Signed)
Called pt and relayed Dr. Clovis Riley advice.  He does not state that he wants an rx for prednisone at this time.  He will purchase a post-op shoe as discussed during his visit and see how he does w/ that.

## 2021-02-08 NOTE — Telephone Encounter (Signed)
Yes I think a postop shoe or even a cam walker boot would absolutely be a good idea. If you are miserable we can prescribe prednisone as well.  Please let me know.

## 2021-02-08 NOTE — Telephone Encounter (Signed)
Pt seen yesterday for foot. His foot is more swollen, more red, and more painful today. He wanted Dr. Georgina Snell to know as he is concerned and unsure if he should purchase a post op shoe or how to proceed since it seems to be worse.

## 2021-02-13 NOTE — Progress Notes (Signed)
Left foot x-ray looks normal to radiology.

## 2021-02-27 ENCOUNTER — Ambulatory Visit (INDEPENDENT_AMBULATORY_CARE_PROVIDER_SITE_OTHER): Payer: Medicare Other | Admitting: Physician Assistant

## 2021-02-27 ENCOUNTER — Other Ambulatory Visit: Payer: Self-pay

## 2021-02-27 ENCOUNTER — Telehealth: Payer: Self-pay

## 2021-02-27 ENCOUNTER — Encounter: Payer: Self-pay | Admitting: Physician Assistant

## 2021-02-27 VITALS — BP 120/70 | HR 60 | Temp 98.3°F | Ht 68.0 in | Wt 154.5 lb

## 2021-02-27 DIAGNOSIS — R202 Paresthesia of skin: Secondary | ICD-10-CM

## 2021-02-27 DIAGNOSIS — I459 Conduction disorder, unspecified: Secondary | ICD-10-CM

## 2021-02-27 DIAGNOSIS — R200 Anesthesia of skin: Secondary | ICD-10-CM | POA: Diagnosis not present

## 2021-02-27 MED ORDER — DICLOFENAC SODIUM 75 MG PO TBEC: 75.0000 mg | DELAYED_RELEASE_TABLET | Freq: Two times a day (BID) | ORAL | 0 refills | Status: DC
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
## 2021-02-27 NOTE — Telephone Encounter (Signed)
NUMBNESS/TINGLING- sudden on one side of the body or face Reason for Call Symptomatic / Request for [place] states having tingling sensations in left arm; comes and goes; began Thurs; Translation No Nurse Assessment Nurse: Clovis Riley, RN, Georgina Peer Date/Time (Eastern Time): 02/27/2021 10:36:35 AM Confirm and document reason for call. If symptomatic, describe symptoms. ---Caller states having tingling sensations in left arm that comes and goes and began on Thursday. Does the patient have any new or worsening symptoms? ---Yes Will a triage be completed? ---Yes Related visit to physician within the last 2 weeks? ---No Does the PT have any chronic conditions? (i.e. diabetes, asthma, this includes High risk factors for pregnancy, etc.) ---Yes List chronic conditions. ---cholesterol Is this a behavioral health or substance abuse call? ---No Guidelines Guideline Title Affirmed Question Affirmed Notes Nurse Date/Time (Eastern Time) Neurologic Deficit [1] Tingling (e.g., pins and needles) of the face, arm / hand, or leg / foot on one side of the body AND [2] present now (Exceptions: Chronic or recurrent symptom lasting > Clovis Riley, RNGeorgina Peer 02/27/2021 10:38:39 AM PLEASE NOTE: All timestamps contained within this report are represented as [place] Standard Time. CONFIDENTIALTY NOTICE: This fax transmission is intended only for the addressee. It contains information that is legally privileged, confidential or otherwise protected from use or disclosure. If you are not the intended recipient, you are strictly prohibited from reviewing, disclosing, copying using or disseminating any of this information or taking any action in reliance on or regarding this information. If you have received this fax in error, please notify us immediately by telephone so that we can arrange for its return to [place]. Phone: [phone], Toll-Free: [phone], Fax: [phone] Page: 2 of  2 Call Id: [id number] Guidelines Guideline Title Affirmed Question Affirmed Notes Nurse Date/Time Eilene Ghazi Time) 4 weeks; or tingling from known cause, such as: bumped elbow, carpal tunnel syndrome, pinched nerve, frostbite.) Disp. Time Eilene Ghazi Time) Disposition Final User 02/27/2021 10:31:05 AM Send to Urgent Mikael Spray 02/27/2021 10:40:48 AM See HCP within 4 Hours (or PCP triage) Yes Clovis Riley, RN, Leilani Merl Disagree/Comply Comply Caller Understands Yes PreDisposition Did not know what to do Care Advice Given Per Guideline SEE HCP (OR PCP TRIAGE) WITHIN 4 HOURS: * IF OFFICE WILL BE OPEN: You need to be seen within the next 3 or 4 hours. Call your doctor (or NP/PA) now or as soon as the office opens. CARE ADVICE given per Neurologic Deficit (Adult) guideline. CALL BACK IF: * You become worse

## 2021-02-27 NOTE — Patient Instructions (Signed)
It was great to see you!  I think you are possibly dealing with cervical radiculopathy (see attached handout)  We can trial daily diclofenac for anti-inflammatory purposes  If you feel like symptoms are worsening or not improving, please let me know  Take care,  Inda Coke PA-C

## 2021-02-27 NOTE — Telephone Encounter (Signed)
FYI

## 2021-03-08 ENCOUNTER — Encounter: Payer: Self-pay | Admitting: Physician Assistant

## 2021-03-13 DIAGNOSIS — Z23 Encounter for immunization: Secondary | ICD-10-CM | POA: Diagnosis not present

## 2021-03-30 ENCOUNTER — Telehealth: Payer: Medicare Other | Admitting: Family Medicine

## 2021-03-30 DIAGNOSIS — U071 COVID-19: Secondary | ICD-10-CM

## 2021-03-30 NOTE — Progress Notes (Signed)
Virtual Visit Consent   Jacob Hood, you are scheduled for a virtual visit with a [facility] provider today.     Just as with appointments in the office, your consent must be obtained to participate.  Your consent will be active for this visit and any virtual visit you may have with one of our providers in the next 365 days.     If you have a MyChart account, a copy of this consent can be sent to you electronically.  All virtual visits are billed to your insurance company just like a traditional visit in the office.    As this is a virtual visit, video technology does not allow for your provider to perform a traditional examination.  This may limit your provider's ability to fully assess your condition.  If your provider identifies any concerns that need to be evaluated in person or the need to arrange testing (such as labs, EKG, etc.), we will make arrangements to do so.     Although advances in technology are sophisticated, we cannot ensure that it will always work on either your end or our end.  If the connection with a video visit is poor, the visit may have to be switched to a telephone visit.  With either a video or telephone visit, we are not always able to ensure that we have a secure connection.     I need to obtain your verbal consent now.   Are you willing to proceed with your visit today?    Jacob Hood has provided verbal consent on 03/30/2021 for a virtual visit (video or telephone).   Perlie Mayo, NP   Date: 03/30/2021 8:48 AM   Virtual Visit via Video Note   I, Perlie Mayo, connected with  Jacob Hood  ([id number], 07/10/1950) on 03/30/21 at  8:45 AM EDT by a video-enabled telemedicine application and verified that I am speaking with the correct person using two identifiers.  Location: Patient: Virtual Visit Location Patient: Home Provider: Virtual Visit Location Provider: Home Office   I discussed the limitations of evaluation and management by  telemedicine and the availability of in person appointments. The patient expressed understanding and agreed to proceed.    History of Present Illness: Jacob Hood is a 70 y.o. who identifies as a male who was assigned male at birth, and is being seen today for covid.  Tested positive this morning on a HT. Sinus congestion earlier part of the week- cough, not feeling well. This morning he feels like his head is very heavy. "Like having a cold".  Temp 99.7.   Denies shortness of breath, chest pain, ear pain, sore throat, unknown sick contacts, just traveled [place].  Booster x 2  Problems:  Patient Active Problem List   Diagnosis Date Noted   Syncope 09/08/2020   Impingement syndrome of right wrist 02/25/2020   Elevated blood pressure reading 09/08/2019   Dyslipidemia 11/24/2018    Allergies:  Allergies  Allergen Reactions   Penicillins    Medications:  Current Outpatient Medications:    calcium-vitamin D (OSCAL WITH D) 500-200 MG-UNIT tablet, Take 1 tablet by mouth., Disp: , Rfl:    Cholecalciferol (VITAMIN D3) 125 MCG (5000 UT) CAPS, , Disp: , Rfl:    co-enzyme Q-10 30 MG capsule, Take 30 mg by mouth 3 (three) times daily., Disp: , Rfl:    diclofenac (VOLTAREN) 75 MG EC tablet, Take 1 tablet (75 mg total) by mouth 2 (two) times daily., Disp: 60 tablet,  Rfl: 0   Omega-3 Fatty Acids (FISH OIL) 1360 MG CAPS, Take by mouth., Disp: , Rfl:    simvastatin (ZOCOR) 20 MG tablet, Take 1 tablet (20 mg total) by mouth daily., Disp: 90 tablet, Rfl: 3  Observations/Objective: Patient is well-developed, well-nourished in no acute distress.  Resting comfortably  at home.  Head is normocephalic, atraumatic.  No labored breathing.  Speech is clear and coherent with logical content.  Patient is alert and oriented at baseline.    Assessment and Plan: 1. COVID-19 -covid + HT _recent travel -mild symptoms- will avoid antivirals at this time -OTC measures reviewed and on AVS -Covid  measures reviewed and on AVS    Follow Up Instructions: I discussed the assessment and treatment plan with the patient. The patient was provided an opportunity to ask questions and all were answered. The patient agreed with the plan and demonstrated an understanding of the instructions.  A copy of instructions were sent to the patient via MyChart unless otherwise noted below.    The patient was advised to call back or seek an in-person evaluation if the symptoms worsen or if the condition fails to improve as anticipated.  Time:  I spent 10 minutes with the patient via telehealth technology discussing the above problems/concerns.    Perlie Mayo, NP

## 2021-03-30 NOTE — Patient Instructions (Signed)
I appreciate the opportunity to provide you with care for your health and wellness.  See below:  Please continue to practice social distancing to keep you, your family, and our community safe.  If you must go out, please wear a mask and practice good handwashing.  Have a wonderful day. With Gratitude, Cherly Beach, DNP, AGNP-BC   Please keep well-hydrated and get plenty of rest. Start a saline nasal rinse to flush out your nasal passages. You can use plain Mucinex to help thin congestion. If you have a humidifier, running in the bedroom at night. I want you to start OTC vitamin D3 1000 units daily, vitamin C 1000 mg daily, and a zinc supplement. Please take prescribed medications as directed.  You have been enrolled in a MyChart symptom monitoring program. Please answer these questions daily so we can keep track of how you are doing.  You were to quarantine for 5 days from onset of your symptoms.  After day 5, if you have had no fever and you are feeling better, you can end quarantine but need to mask for an additional 5 days. After day 5 if you have a fever or are having significant symptoms, please quarantine for full 10 days.  If you note any worsening of symptoms, any significant shortness of breath or any chest pain, please seek ER evaluation ASAP.  Please do not delay care!  COVID-19: What to Do if You Are Sick If you test positive and are an older adult or someone who is at high risk of getting very sick from COVID-19, treatment may be available. Contact a healthcare provider right away after a positive test to determine if you are eligible, even if your symptoms are mild right now. You can also visit a Test to Treat location and, if eligible, receive a prescription from a provider. Don't delay: Treatment must be started within the first few days to be effective. If you have a fever, cough, or other symptoms, you might have COVID-19. Most people have mild illness and are able to  recover at home. If you are sick: Keep track of your symptoms. If you have an emergency warning sign (including trouble breathing), call 911. Steps to help prevent the spread of COVID-19 if you are sick If you are sick with COVID-19 or think you might have COVID-19, follow the steps below to care for yourself and to help protect other people in your home and community. Stay home except to get medical care Stay home. Most people with COVID-19 have mild illness and can recover at home without medical care. Do not leave your home, except to get medical care. Do not visit public areas and do not go to places where you are unable to wear a mask. Take care of yourself. Get rest and stay hydrated. Take over-the-counter medicines, such as acetaminophen, to help you feel better. Stay in touch with your doctor. Call before you get medical care. Be sure to get care if you have trouble breathing, or have any other emergency warning signs, or if you think it is an emergency. Avoid public transportation, ride-sharing, or taxis if possible. Get tested If you have symptoms of COVID-19, get tested. While waiting for test results, stay away from others, including staying apart from those living in your household. Get tested as soon as possible after your symptoms start. Treatments may be available for people with COVID-19 who are at risk for becoming very sick. Don't delay: Treatment must be started early to  be effective--some treatments must begin within 5 days of your first symptoms. Contact your healthcare provider right away if your test result is positive to determine if you are eligible. Self-tests are one of several options for testing for the virus that causes COVID-19 and may be more convenient than laboratory-based tests and point-of-care tests. Ask your healthcare provider or your local health department if you need help interpreting your test results. You can visit your state, tribal, local, and territorial  health department's website to look for the latest local information on testing sites. Separate yourself from other people As much as possible, stay in a specific room and away from other people and pets in your home. If possible, you should use a separate bathroom. If you need to be around other people or animals in or outside of the home, wear a well-fitting mask. Tell your close contacts that they may have been exposed to COVID-19. An infected person can spread COVID-19 starting 48 hours (or 2 days) before the person has any symptoms or tests positive. By letting your close contacts know they may have been exposed to COVID-19, you are helping to protect everyone. See COVID-19 and Animals if you have questions about pets. If you are diagnosed with COVID-19, someone from the health department may call you. Answer the call to slow the spread. Monitor your symptoms Symptoms of COVID-19 include fever, cough, or other symptoms. Follow care instructions from your healthcare provider and local health department. Your local health authorities may give instructions on checking your symptoms and reporting information. When to seek emergency medical attention Look for emergency warning signs* for COVID-19. If someone is showing any of these signs, seek emergency medical care immediately: Trouble breathing Persistent pain or pressure in the chest New confusion Inability to wake or stay awake Pale, gray, or blue-colored skin, lips, or nail beds, depending on skin tone *This list is not all possible symptoms. Please call your medical provider for any other symptoms that are severe or concerning to you. Call 911 or call ahead to your local emergency facility: Notify the operator that you are seeking care for someone who has or may have COVID-19. Call ahead before visiting your doctor Call ahead. Many medical visits for routine care are being postponed or done by phone or telemedicine. If you have a medical  appointment that cannot be postponed, call your doctor's office, and tell them you have or may have COVID-19. This will help the office protect themselves and other patients. If you are sick, wear a well-fitting mask You should wear a mask if you must be around other people or animals, including pets (even at home). Wear a mask with the best fit, protection, and comfort for you. You don't need to wear the mask if you are alone. If you can't put on a mask (because of trouble breathing, for example), cover your coughs and sneezes in some other way. Try to stay at least 6 feet away from other people. This will help protect the people around you. Masks should not be placed on young children under age 39 years, anyone who has trouble breathing, or anyone who is not able to remove the mask without help. Cover your coughs and sneezes Cover your mouth and nose with a tissue when you cough or sneeze. Throw away used tissues in a lined trash can. Immediately wash your hands with soap and water for at least 20 seconds. If soap and water are not available, clean your hands with  an alcohol-based hand sanitizer that contains at least 60% alcohol. Clean your hands often Wash your hands often with soap and water for at least 20 seconds. This is especially important after blowing your nose, coughing, or sneezing; going to the bathroom; and before eating or preparing food. Use hand sanitizer if soap and water are not available. Use an alcohol-based hand sanitizer with at least 60% alcohol, covering all surfaces of your hands and rubbing them together until they feel dry. Soap and water are the best option, especially if hands are visibly dirty. Avoid touching your eyes, nose, and mouth with unwashed hands. Handwashing Tips Avoid sharing personal household items Do not share dishes, drinking glasses, cups, eating utensils, towels, or bedding with other people in your home. Wash these items thoroughly after using them  with soap and water or put in the dishwasher. Clean surfaces in your home regularly Clean and disinfect high-touch surfaces (for example, doorknobs, tables, handles, light switches, and countertops) in your "sick room" and bathroom. In shared spaces, you should clean and disinfect surfaces and items after each use by the person who is ill. If you are sick and cannot clean, a caregiver or other person should only clean and disinfect the area around you (such as your bedroom and bathroom) on an as needed basis. Your caregiver/other person should wait as long as possible (at least several hours) and wear a mask before entering, cleaning, and disinfecting shared spaces that you use. Clean and disinfect areas that may have blood, stool, or body fluids on them. Use household cleaners and disinfectants. Clean visible dirty surfaces with household cleaners containing soap or detergent. Then, use a household disinfectant. Use a product from [employer] List N: Disinfectants for Coronavirus (PHXTA-56). Be sure to follow the instructions on the label to ensure safe and effective use of the product. Many products recommend keeping the surface wet with a disinfectant for a certain period of time (look at "contact time" on the product label). You may also need to wear personal protective equipment, such as gloves, depending on the directions on the product label. Immediately after disinfecting, wash your hands with soap and water for 20 seconds. For completed guidance on cleaning and disinfecting your home, visit Complete Disinfection Guidance. Take steps to improve ventilation at home Improve ventilation (air flow) at home to help prevent from spreading COVID-19 to other people in your household. Clear out COVID-19 virus particles in the air by opening windows, using air filters, and turning on fans in your home. Use this interactive tool to learn how to improve air flow in your home. When you can be around others after  being sick with COVID-19 Deciding when you can be around others is different for different situations. Find out when you can safely end home isolation. For any additional questions about your care, contact your healthcare provider or state or local health department. 09/12/2020 Content source: [facility] for Immunization and Respiratory Diseases ([facility]), Division of Viral Diseases This information is not intended to replace advice given to you by your health care provider. Make sure you discuss any questions you have with your health care provider. Document Revised: 10/26/2020 Document Reviewed: 10/26/2020 Elsevier Patient Education  [place].

## 2021-04-03 DIAGNOSIS — Z20822 Contact with and (suspected) exposure to covid-19: Secondary | ICD-10-CM | POA: Diagnosis not present

## 2021-04-12 DIAGNOSIS — Z23 Encounter for immunization: Secondary | ICD-10-CM | POA: Diagnosis not present

## 2021-04-13 ENCOUNTER — Encounter: Payer: Self-pay | Admitting: Physician Assistant

## 2021-04-16 ENCOUNTER — Other Ambulatory Visit: Payer: Self-pay

## 2021-04-16 ENCOUNTER — Ambulatory Visit (INDEPENDENT_AMBULATORY_CARE_PROVIDER_SITE_OTHER): Payer: Medicare Other | Admitting: Family Medicine

## 2021-04-16 VITALS — BP 142/88 | HR 58 | Ht 68.0 in | Wt 157.4 lb

## 2021-04-16 DIAGNOSIS — M25511 Pain in right shoulder: Secondary | ICD-10-CM | POA: Diagnosis not present

## 2021-04-16 DIAGNOSIS — M79672 Pain in left foot: Secondary | ICD-10-CM

## 2021-04-16 NOTE — Patient Instructions (Addendum)
Thank you for coming in today.   Plan for MRI of your left foot.  Please complete the exercises for your shoulder that the athletic trainer went over with you:  View at [email or web address]-exercise-code.com using code: [facility]  Recheck after MRI

## 2021-04-16 NOTE — Progress Notes (Signed)
I, Wendy Poet, LAT, ATC, am serving as scribe for Dr. Lynne Leader.  Jacob Hood is a 70 y.o. male who presents to [place] at [facility] today for f/u of L foot/medial arch pain and R arm soreness.  He was last seen by Dr. Georgina Snell on 02/07/21 for L medial, plantar arch pain that he injured while playing pickleball.  He was shown a HEP focusing on foot intrinsic / post tib strengthening and was advised to use Voltaren gel and an arch strap.  Today, pt reports L foot has improved, but is still bothersome. Pt hasn't been able to walk as much as he would like or play pickleball. Pt reports continued R arm pain. Pt locates pain to the lateral aspect of the R shoulder/deltoid. Pt notes increased pain w/ overhead motions, ER, and horz ABD.  Diagnostic testing: L foot XR- 02/07/21  Pertinent review of systems: no fever or chills  Relevant historical information: Dyslipidemia.   Exam:  BP (!) 142/88   Pulse (!) 58   Ht 5\' 8"  (1.727 m)   Wt 157 lb 6.4 oz (71.4 kg)   SpO2 99%   BMI 23.93 kg/m  General: Well Developed, well nourished, and in no acute distress.   MSK: Left foot: Normal-appearing Tender palpation along the mid foot plantar aspect on longitudinal arch.  Also mildly tender palpation along plantar proximal fifth metatarsal and along the fifth ray. Normal foot and ankle motion.  Right shoulder normal-appearing Normal motion pain with abduction.  Intact strength.  Mildly positive Hawkins and Neer's test. Negative Yergason's and speeds test.    Lab and Radiology Results EXAM: LEFT FOOT - COMPLETE 3+ VIEW   COMPARISON:  None.   FINDINGS: There is no evidence of fracture or dislocation. There is no evidence of arthropathy or other focal bone abnormality. Soft tissues are unremarkable.   IMPRESSION: No acute abnormality noted.     Electronically Signed   By: Inez Catalina M.D.   On: 02/08/2021 19:43 I, Lynne Leader, personally (independently)  visualized and performed the interpretation of the images attached in this note.      Assessment and Plan: 70 y.o. male with right plantar foot pain ongoing for greater than 2 months.  Patient has been treated with physician directed home exercise program which she has been compliant with.  For example at least 3 days a week and at least 20 minutes. At this point etiology is somewhat unclear.  Certainly could be a soft tissue injury or intrinsic muscle strain or injury or dysfunction.  Additionally could be bony issue not well visualized with x-ray.  Plan for MRI of the foot to evaluate source of pain.  Recheck after MRI.  Right shoulder pain due to mild shoulder impingement.  Home exercise program taught in clinic today by ATC.   PDMP not reviewed this encounter. Orders Placed This Encounter  Procedures   MR FOOT LEFT WO CONTRAST    Left foot pain    Standing Status:   Future    Standing Expiration Date:   04/16/2022    Order Specific Question:   What is the patient's sedation requirement?    Answer:   No Sedation    Order Specific Question:   Does the patient have a pacemaker or implanted devices?    Answer:   No    Order Specific Question:   Preferred imaging location?    Answer:   Product/process development scientist (table limit-350lbs)   No orders of  the defined types were placed in this encounter.    Discussed warning signs or symptoms. Please see discharge instructions. Patient expresses understanding.   [email or web address]

## 2021-04-17 NOTE — Telephone Encounter (Signed)
I see them uploaded under documents in the registration tab.

## 2021-04-23 ENCOUNTER — Encounter: Payer: Self-pay | Admitting: Family Medicine

## 2021-04-24 DIAGNOSIS — Z20822 Contact with and (suspected) exposure to covid-19: Secondary | ICD-10-CM | POA: Diagnosis not present

## 2021-04-29 ENCOUNTER — Other Ambulatory Visit: Payer: Self-pay

## 2021-04-29 ENCOUNTER — Other Ambulatory Visit: Payer: Medicare Other

## 2021-04-29 ENCOUNTER — Ambulatory Visit (INDEPENDENT_AMBULATORY_CARE_PROVIDER_SITE_OTHER): Payer: Medicare Other

## 2021-04-29 DIAGNOSIS — M79672 Pain in left foot: Secondary | ICD-10-CM

## 2021-04-29 DIAGNOSIS — M19072 Primary osteoarthritis, left ankle and foot: Secondary | ICD-10-CM | POA: Diagnosis not present

## 2021-04-29 DIAGNOSIS — G8929 Other chronic pain: Secondary | ICD-10-CM

## 2021-05-02 NOTE — Progress Notes (Signed)
MRI does not show a clear obvious problem but it does show some potential sources of pain. 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  Usually this should not produce pain but it is possible that this could be a source of pain for you. Additionally one of the long foot bones has a little irritation possibly a stress reaction. And there is some injury to irritation under the big toe.  Recommend return to clinic to go over the results of full detail and discuss treatment plan and options.

## 2021-05-03 NOTE — Progress Notes (Signed)
   I, Wendy Poet, LAT, ATC, am serving as scribe for Dr. Lynne Leader.  Jacob Hood is a 70 y.o. male who presents to [place] at [facility] today for f/u of L foot/medial arch pain and L foot MRI review.  He was last seen by Dr. Georgina Snell on 04/16/21 and was referred for an MRI of his foot and was also shown a HEP for his R shoulder pain.  He was previously shown a HEP focusing on foot intrinsic / post tib strengthening and was advised to use Voltaren gel and an arch strap for his L foot.  Today, pt reports that his foot pain is improving. Still has pain when barefoot or a less supportive shoe.   R shoulder pain unchanged. Pain with IR and ER. Has been working on [employer] which he said might be helping slightly.  He is only been doing the exercises for a week or 2 now.  He thinks with more exercise he probably will get better.  Diagnostic testing: L foot MRI- 04/29/21; L foot XR- 02/07/21   Pertinent review of systems: No fevers or chills  Relevant historical information: History of syncope.   Exam:  BP 124/70   Pulse 65   Ht 5\' 8"  (1.727 m)   Wt 158 lb (71.7 kg)   SpO2 98%   BMI 24.02 kg/m  General: Well Developed, well nourished, and in no acute distress.   MSK:  Left foot Normal-appearing Mildly tender palpation plantar and dorsal mid arch medially. Minimally tender along left fifth metatarsal. Nontender plantar first MTP.  Right shoulder normal motion normal strength.    Lab and Radiology Results No results found for this or any previous visit (from the past 72 hour(s)). No results found.     Assessment and Plan: 70 y.o. male with Left midfoot pain multifactorial.  Patient has bipartite medial cuneiform which probably is the main source of his mid arch medial foot pain.  I think it likely got exacerbated with an injury recently.  He is already improving with good supportive footwear and some intrinsic foot exercises.  Plan to continue simple  conservative management exercises and support and check back in a month.  Recommend abstaining from pickleball for 1 month.  Reassess in 1 month if need to consider injection.  Left lateral forefoot pain to midfoot pain.  Due to stress reaction seen at fifth metatarsal.  He is altered his gait a little bit and is walking a little more on the lateral part of his foot and overstressing the fifth metatarsal.  As his mid arch pain is improving his gait is returning to normal and I think this issue will improve spontaneously.  If needed switch to postop shoe.  Right shoulder pain.  Continue home exercise program.  This should improve with time.  If needed he will let me know and we will proceed to do formal physical therapy.  Check in 1 month.  Total encounter time 20 minutes including face-to-face time with the patient and, reviewing past medical record, and charting on the date of service.   Discussed treatment plan and options and reviewed MRI.  Discussed warning signs or symptoms. Please see discharge instructions. Patient expresses understanding.   The above documentation has been reviewed and is accurate and complete Lynne Leader, M.D.

## 2021-05-04 ENCOUNTER — Encounter: Payer: Self-pay | Admitting: Family Medicine

## 2021-05-04 ENCOUNTER — Other Ambulatory Visit: Payer: Self-pay

## 2021-05-04 ENCOUNTER — Ambulatory Visit (INDEPENDENT_AMBULATORY_CARE_PROVIDER_SITE_OTHER): Payer: Medicare Other | Admitting: Family Medicine

## 2021-05-04 VITALS — BP 124/70 | HR 65 | Ht 68.0 in | Wt 158.0 lb

## 2021-05-04 DIAGNOSIS — M79672 Pain in left foot: Secondary | ICD-10-CM

## 2021-05-04 DIAGNOSIS — M25511 Pain in right shoulder: Secondary | ICD-10-CM | POA: Diagnosis not present

## 2021-05-04 NOTE — Patient Instructions (Signed)
Continue HEP and check back in 1 month Continue to wear shoes with good arch support

## 2021-05-07 ENCOUNTER — Telehealth: Payer: Medicare Other | Admitting: Physician Assistant

## 2021-05-07 DIAGNOSIS — B9689 Other specified bacterial agents as the cause of diseases classified elsewhere: Secondary | ICD-10-CM | POA: Diagnosis not present

## 2021-05-07 DIAGNOSIS — J019 Acute sinusitis, unspecified: Secondary | ICD-10-CM

## 2021-05-07 MED ORDER — DOXYCYCLINE HYCLATE 100 MG PO TABS: 100.0000 mg | ORAL_TABLET | Freq: Two times a day (BID) | ORAL | 0 refills | Status: DC

## 2021-05-07 NOTE — Addendum Note (Signed)
Addended by: Mar Daring on: 05/07/2021 01:21 PM   Modules accepted: Orders

## 2021-05-07 NOTE — Progress Notes (Signed)

## 2021-05-24 DIAGNOSIS — Z20822 Contact with and (suspected) exposure to covid-19: Secondary | ICD-10-CM | POA: Diagnosis not present

## 2021-06-04 NOTE — Progress Notes (Signed)
I, Wendy Poet, LAT, ATC, am serving as scribe for Dr. Lynne Leader.  Jacob Hood is a 70 y.o. male who presents to [place] at [facility] today for f/u of L foot/medial arch and R shoulder pain.  He was last seen by Dr. Georgina Snell on 05/04/21 and was advised to con't HEP for both his L foot/post tib and R shoulder.  He was also advised to con't to wear shoes w/ good arch support.  Today, pt reports that both his L foot/medial arch and R shoulder pain are improving.  His symptoms are intermittent in nature.  He has been trying to resume some limited exercise.  He has been doing his foot HEP but hasn't been doing his shoulder HEP as frequently.  He does yoga and likes to play pickleball but hasn't been playing pickleball due to his L foot pain.  Diagnostic testing: L foot MRI- 04/29/21; L foot XR- 02/07/21  Pertinent review of systems: No fevers or chills  Relevant historical information: Dyslipidemia   Exam:  BP 112/76 (BP Location: Left Arm, Patient Position: Sitting, Cuff Size: Normal)   Pulse 68   Ht 5\' 8"  (1.727 m)   Wt 159 lb 12.8 oz (72.5 kg)   SpO2 98%   BMI 24.30 kg/m  General: Well Developed, well nourished, and in no acute distress.   MSK: Left foot minimally tender palpation medial mid arch.  Nontender lateral foot and fifth metatarsal.    Lab and Radiology Results  EXAM: MRI OF THE LEFT FOOT WITHOUT CONTRAST   TECHNIQUE: Multiplanar, multisequence MR imaging of the left forefoot was performed. No intravenous contrast was administered.   COMPARISON:  Foot radiograph 02/07/2021   FINDINGS: Bones/Joint/Cartilage   The cortex is intact. There is mild first MTP osteoarthritis. There is a bipartite medial cuneiform, with mild focal edema and cortical irregularity suggesting a partial fusion and or degenerative change (axial T2 image 20, coronal T2 image 12).   Minimal bony edema signal within the fifth metatarsal shaft without visible fracture  line.   Mild edema signal within the lateral great toe sesamoid at the MTP joint.   Ligaments   The Lisfranc ligament appears intact. No evidence of plantar plate tear.   Muscles and Tendons   No significant muscle atrophy or edema.   Soft tissues   No evidence of intermetatarsal neuroma or bursitis. No focal fluid collection. There is mild diffuse soft tissue swelling of the foot.   IMPRESSION: Bipartite medial cuneiform, with mild focal edema and cortical irregularity suggesting a partial fusion and or degenerative change. This could potentially be a source of medial arch pain, although this is most often considered an incidental and asymptomatic finding.   Minimal bony edema signal within the fifth metatarsal shaft, without visible fracture line, favored to be a stress reaction.   Mild edema signal within the lateral great toe sesamoid at the MTP joint, consistent with mild sesamoiditis or metatarso-sesamoid degenerative arthritis. Mild first MTP osteoarthritis.   No acute tendon tear or ligamentous injury the midfoot or forefoot.     Electronically Signed   By: Maurine Simmering M.D.   On: 05/01/2021 13:00   I, Lynne Leader, personally (independently) visualized and performed the interpretation of the images attached in this note.      Assessment and Plan: 70 y.o. male with left foot midfoot pain.  Pain thought to be due to the bipartite medial cuneiform.  He has had some benefit with over-the-counter arch support  insoles and scaphoid pads, home exercise, or arch straps, and relative rest.  However as he returns to activity he would benefit from a custom sports orthotic.  Will refer to my colleague Dr. Raeford Razor for custom orthotics.  If still not doing well injection would be helpful.  Discussed treatment plan and options with patient expresses understanding and agreement.  Total encounter time 20 minutes including face-to-face time with the patient and, reviewing past  medical record, and charting on the date of service.     PDMP not reviewed this encounter. Orders Placed This Encounter  Procedures   AMB referral to sports medicine    Referral Priority:   Routine    Referral Type:   Consultation    Number of Visits Requested:   1   No orders of the defined types were placed in this encounter.    Discussed warning signs or symptoms. Please see discharge instructions. Patient expresses understanding.   The above documentation has been reviewed and is accurate and complete Lynne Leader, M.D.

## 2021-06-05 ENCOUNTER — Ambulatory Visit (INDEPENDENT_AMBULATORY_CARE_PROVIDER_SITE_OTHER): Payer: Medicare Other | Admitting: Family Medicine

## 2021-06-05 ENCOUNTER — Telehealth: Payer: Self-pay | Admitting: Family Medicine

## 2021-06-05 ENCOUNTER — Other Ambulatory Visit: Payer: Self-pay

## 2021-06-05 ENCOUNTER — Encounter: Payer: Self-pay | Admitting: Family Medicine

## 2021-06-05 VITALS — BP 112/76 | HR 68 | Ht 68.0 in | Wt 159.8 lb

## 2021-06-05 DIAGNOSIS — M79672 Pain in left foot: Secondary | ICD-10-CM | POA: Diagnosis not present

## 2021-06-05 NOTE — Telephone Encounter (Signed)
Called pt to schedule referral appt, no answer, left him a message to call office.  ----- Message -----  From: Gregor Hams, MD  Sent: 06/05/2021   9:17 AM EST  To: Rosemarie Ax, MD  Subject: orthotics                                       I am referring Jacob Hood to you for orthotics.   Can you do that on the first visit?  He has a bipartate medial cuneiform and could use the arch support. (See his MRI).

## 2021-06-05 NOTE — Patient Instructions (Addendum)
Good to see you today.  I've referred you to see Dr. Raeford Razor for orthotics.  His office will call you to schedule.  Happy Holidays!  Follow-up: as needed

## 2021-06-11 ENCOUNTER — Ambulatory Visit (INDEPENDENT_AMBULATORY_CARE_PROVIDER_SITE_OTHER): Payer: Medicare Other | Admitting: Family Medicine

## 2021-06-11 ENCOUNTER — Encounter: Payer: Self-pay | Admitting: Family Medicine

## 2021-06-11 VITALS — Ht 68.0 in | Wt 159.0 lb

## 2021-06-11 DIAGNOSIS — M2170 Unequal limb length (acquired), unspecified site: Secondary | ICD-10-CM

## 2021-06-11 DIAGNOSIS — M25872 Other specified joint disorders, left ankle and foot: Secondary | ICD-10-CM

## 2021-06-11 NOTE — Progress Notes (Signed)
°  Jacob Hood - 70 y.o. male MRN [id number]  Date of birth: [birth year]  SUBJECTIVE:  Including CC & ROS.  No chief complaint on file.   Jacob Hood is a 70 y.o. male that is presenting with acute left foot pain.  The pain is occurring at the left first MTP joint.  He continues to be active and plays pickleball.    Review of Systems See HPI   HISTORY: Past Medical, Surgical, Social, and Family History Reviewed & Updated per EMR.   Pertinent Historical Findings include:  Past Medical History:  Diagnosis Date   Hyperlipidemia     Past Surgical History:  Procedure Laterality Date   TONSILLECTOMY      Family History  Problem Relation Age of Onset   Dementia Mother    Heart attack Father    Cancer Neg Hx     Social History   Socioeconomic History   Marital status: Married    Spouse name: Not on file   Number of children: Not on file   Years of education: Not on file   Highest education level: Not on file  Occupational History   Not on file  Tobacco Use   Smoking status: Former   Smokeless tobacco: Never  Vaping Use   Vaping Use: Never used  Substance and Sexual Activity   Alcohol use: Yes    Comment: Socially   Drug use: Never   Sexual activity: Yes  Other Topics Concern   Not on file  Social History Narrative   Not on file   Social Determinants of Health   Financial Resource Strain: Not on file  Food Insecurity: Not on file  Transportation Needs: Not on file  Physical Activity: Not on file  Stress: Not on file  Social Connections: Not on file  Intimate Partner Violence: Not on file     PHYSICAL EXAM:  VS: Ht 5\' 8"  (1.727 m)    Wt 159 lb (72.1 kg)    BMI 24.18 kg/m  Physical Exam Gen: NAD, alert, cooperative with exam, well-appearing   Patient was fitted for a standard, cushioned, semi-rigid orthotic. The orthotic was heated and afterward the patient stood on the orthotic blank positioned on the orthotic stand. The patient was positioned in  subtalar neutral position and 10 degrees of ankle dorsiflexion in a weight bearing stance. After completion of molding, a stable base was applied to the orthotic blank. The blank was ground to a stable position for weight bearing. Size: 9 Pairs: 2 Base: Blue EVA Additional Posting and Padding: right heel lift, left first ray post.  The patient ambulated these, and they were very comfortable.    ASSESSMENT & PLAN:   Sesamoiditis of left foot Seems to have some irritation of the sesamoid with edema on MRI.  Right leg is mildly shorter which could be contributing to excessive force to the left foot.  -Counseled on home exercise therapy and supportive care. -Orthotics today.  Provided right heel lift and left first MTP joint posting

## 2021-06-12 ENCOUNTER — Telehealth: Payer: Self-pay

## 2021-06-12 ENCOUNTER — Ambulatory Visit (INDEPENDENT_AMBULATORY_CARE_PROVIDER_SITE_OTHER): Payer: Medicare Other | Admitting: Physician Assistant

## 2021-06-12 ENCOUNTER — Encounter: Payer: Self-pay | Admitting: Physician Assistant

## 2021-06-12 VITALS — BP 118/70 | HR 60 | Temp 98.1°F | Ht 68.0 in | Wt 161.0 lb

## 2021-06-12 DIAGNOSIS — M25872 Other specified joint disorders, left ankle and foot: Secondary | ICD-10-CM | POA: Insufficient documentation

## 2021-06-12 DIAGNOSIS — B9689 Other specified bacterial agents as the cause of diseases classified elsewhere: Secondary | ICD-10-CM | POA: Diagnosis not present

## 2021-06-12 DIAGNOSIS — J019 Acute sinusitis, unspecified: Secondary | ICD-10-CM | POA: Diagnosis not present

## 2021-06-12 DIAGNOSIS — M25571 Pain in right ankle and joints of right foot: Secondary | ICD-10-CM | POA: Diagnosis not present

## 2021-06-12 MED ORDER — AZITHROMYCIN 250 MG PO TABS: ORAL_TABLET | ORAL | 0 refills | Status: AC

## 2021-06-12 NOTE — Telephone Encounter (Signed)
States she is having an allergic response coming up in her system for zithromax.   Is requesting provider send over a script for something in place of this.

## 2021-06-12 NOTE — Progress Notes (Signed)
Jacob Hood is a 70 y.o. male here for a new problem.  I acted as a Education administrator for [employer], PA-C [employer], LPN   History of Present Illness:   Chief Complaint  Patient presents with   Sinus Problem   Ankle Pain    HPI  Sinus problem Pt c/o headache, sinus pressure, cough started last Monday. Had earache on Saturday but has resolved. Denies fever or chills. COVID test on Saturday was Neg. He had been taking Mucinex and doing saline nasal washes. He was treated about a month ago for sinus infection, was given oral doxycycline and took as prescribed. No sick contacts. Symptoms are getting progressively worse with time. No SOB.  Ankle pain Sudden pain in foot on Sunday. Pt has been having right ankle pain since Sunday. Denies no significant injury that he can recall.  He used Voltaren gel with some relief. Hurts when he walks on it. Has not tried ibuprofen or tylenol. At its worst, it is 6/10. Denies swelling in calf, prior hx of blood clot, redness, swelling of ankle.   Past Medical History:  Diagnosis Date   Hyperlipidemia      Social History   Tobacco Use   Smoking status: Former   Smokeless tobacco: Never  Scientific laboratory technician Use: Never used  Substance Use Topics   Alcohol use: Yes    Comment: Socially   Drug use: Never    Past Surgical History:  Procedure Laterality Date   TONSILLECTOMY      Family History  Problem Relation Age of Onset   Dementia Mother    Heart attack Father    Cancer Neg Hx     Allergies  Allergen Reactions   Penicillins     Current Medications:   Current Outpatient Medications:    azithromycin (ZITHROMAX) 250 MG tablet, Take 2 tablets on day 1, then 1 tablet daily on days 2 through 5, Disp: 6 tablet, Rfl: 0   calcium-vitamin D (OSCAL WITH D) 500-200 MG-UNIT tablet, Take 1 tablet by mouth., Disp: , Rfl:    Cholecalciferol (VITAMIN D3) 125 MCG (5000 UT) CAPS, , Disp: , Rfl:    co-enzyme Q-10 30 MG capsule, Take 30 mg by  mouth 3 (three) times daily., Disp: , Rfl:    Omega-3 Fatty Acids (FISH OIL) 1360 MG CAPS, Take by mouth., Disp: , Rfl:    simvastatin (ZOCOR) 20 MG tablet, Take 1 tablet (20 mg total) by mouth daily., Disp: 90 tablet, Rfl: 3   Review of Systems:   ROS Negative unless otherwise specified per HPI.   Vitals:   Vitals:   06/12/21 1406  BP: 118/70  Pulse: 60  Temp: 98.1 F (36.7 C)  TempSrc: Temporal  SpO2: 98%  Weight: 161 lb (73 kg)  Height: 5\' 8"  (1.727 m)     Body mass index is 24.48 kg/m.  Physical Exam:   Physical Exam Vitals and nursing note reviewed.  Constitutional:      General: He is not in acute distress.    Appearance: He is well-developed. He is not ill-appearing or toxic-appearing.  HENT:     Head: Normocephalic and atraumatic.     Right Ear: Tympanic membrane, ear canal and external ear normal. Tympanic membrane is not erythematous, retracted or bulging.     Left Ear: Tympanic membrane, ear canal and external ear normal. Tympanic membrane is not erythematous, retracted or bulging.     Nose:     Right Sinus: Maxillary sinus  tenderness present. No frontal sinus tenderness.     Left Sinus: Maxillary sinus tenderness present. No frontal sinus tenderness.     Mouth/Throat:     Mouth: Mucous membranes are moist.     Pharynx: Uvula midline. Posterior oropharyngeal erythema present.  Eyes:     General: Lids are normal.     Conjunctiva/sclera: Conjunctivae normal.  Neck:     Trachea: Trachea normal.  Cardiovascular:     Rate and Rhythm: Normal rate and regular rhythm.     Pulses:          Popliteal pulses are 2+ on the right side.       Dorsalis pedis pulses are 2+ on the right side.     Heart sounds: Normal heart sounds, S1 normal and S2 normal.     Comments: Cap refill normal in R toes Pulmonary:     Effort: Pulmonary effort is normal.     Breath sounds: Normal breath sounds. No decreased breath sounds, wheezing, rhonchi or rales.  Musculoskeletal:      Comments: R ankle: No decreased ROM No swelling/tenderness  No R calf tenderness/swelling  Lymphadenopathy:     Cervical: No cervical adenopathy.  Skin:    General: Skin is warm and dry.  Neurological:     Mental Status: He is alert.  Psychiatric:        Speech: Speech normal.        Behavior: Behavior normal. Behavior is cooperative.    Assessment and Plan:   Acute bacterial sinusitis No red flags on exam.  Will initiate azithromycin per orders. Discussed taking medications as prescribed. Reviewed return precautions including worsening fever, SOB, worsening cough or other concerns. Push fluids and rest. I recommend that patient follow-up if symptoms worsen or persist despite treatment x 7-10 days, sooner if needed.  Acute right ankle pain No red flags on exam Today is day 3 of sx, without worsening; we discussed watchful waiting vs sending to sports medicine now- -- we opted for watchful waiting x 2 weeks ACE bandage applied today Recommend continue voltaren gel Elevate If new/worsening symptoms OR if no improvement after 2 weeks -- was advised to let us know  CMA or LPN served as scribe during this visit. History, Physical, and Plan performed by medical provider. The above documentation has been reviewed and is accurate and complete.  Time spent with patient today was 25 minutes which consisted of chart review, discussing diagnosis, work up, treatment answering questions and documentation.   Inda Coke, PA-C

## 2021-06-12 NOTE — Assessment & Plan Note (Signed)
Seems to have some irritation of the sesamoid with edema on MRI.  Right leg is mildly shorter which could be contributing to excessive force to the left foot.  -Counseled on home exercise therapy and supportive care. -Orthotics today.  Provided right heel lift and left first MTP joint posting

## 2021-06-12 NOTE — Telephone Encounter (Signed)
[facility] and spoke to [place], told her pt has taken Zithromax before and has not had an issue with this. The only medication we have in our system that pt is allergic to is Penicillin. Kristen verbalized understanding.

## 2021-06-12 NOTE — Telephone Encounter (Signed)
Please see message and advise 

## 2021-06-24 DIAGNOSIS — Z20822 Contact with and (suspected) exposure to covid-19: Secondary | ICD-10-CM | POA: Diagnosis not present

## 2021-07-02 ENCOUNTER — Encounter: Payer: Self-pay | Admitting: Physician Assistant

## 2021-07-04 NOTE — Progress Notes (Deleted)
Jacob Hood is a 71 y.o. male here for a recurrence of a previously resolved sinus problem.    History of Present Illness:   Chief Complaint  Patient presents with   Cough    Pt c/o lingering cough, dry non-productive at night and when he lays down. Also Head congestion    HPI  Sinus Problem Pt was recently seen for this issue on 06/12/21 where he stated he was experiencing headache, sinus pressure, and cough. [place] also reported he had experienced ear pain, but this resolved prior to the visit. After testing negative for COVID at that time, he tried taking mucinex and using saline nasal washes which provided minor relief. Due to sx, I prescribed him with azithromycin 250 mg daily and encouraged him to push more fluids.   Currently Shad returns with c/o continued head congestion and intermittent cough.  He reports that he has completed previously prescribed medication as instructed, but only a couple of days later his head congestion and cough returned. At this time he states that cough is occasional, mainly occurring for a brief amount of time upon lying down. Pt describes that cough can feel like a deep sensation or a simple tickle in his throat. Due to return of sx, he did taken a covid test on 07/02/21 but the results were negative. Additionally he states that his head congestion mainly occurs in the morning, gradually getting better as the day goes on.   In an effort to manage his sx, he has taken Mucinex which has provided relief, but he expresses worry about the lingering cough he has. Denies fever, chills, or post nasal drip.   Past Medical History:  Diagnosis Date   Hyperlipidemia      Social History   Tobacco Use   Smoking status: Former   Smokeless tobacco: Never  Scientific laboratory technician Use: Never used  Substance Use Topics   Alcohol use: Yes    Comment: Socially   Drug use: Never    Past Surgical History:  Procedure Laterality Date   TONSILLECTOMY      Family  History  Problem Relation Age of Onset   Dementia Mother    Heart attack Father    Cancer Neg Hx     Allergies  Allergen Reactions   Penicillins     Current Medications:   Current Outpatient Medications:    calcium-vitamin D (OSCAL WITH D) 500-200 MG-UNIT tablet, Take 1 tablet by mouth., Disp: , Rfl:    Cholecalciferol (VITAMIN D3) 125 MCG (5000 UT) CAPS, , Disp: , Rfl:    co-enzyme Q-10 30 MG capsule, Take 30 mg by mouth 3 (three) times daily., Disp: , Rfl:    Omega-3 Fatty Acids (FISH OIL) 1360 MG CAPS, Take by mouth., Disp: , Rfl:    simvastatin (ZOCOR) 20 MG tablet, Take 1 tablet (20 mg total) by mouth daily., Disp: 90 tablet, Rfl: 3   Review of Systems:   ROS Negative unless otherwise specified per HPI. Vitals:   Vitals:   07/05/21 1029  BP: 128/80  Pulse: (!) 59  Temp: 97.7 F (36.5 C)  TempSrc: Temporal  SpO2: 98%  Weight: 160 lb 4 oz (72.7 kg)  Height: 5\' 8"  (1.727 m)     Body mass index is 24.37 kg/m.  Physical Exam:   Physical Exam Vitals and nursing note reviewed.  Constitutional:      General: He is not in acute distress.    Appearance: He is well-developed. He is  not ill-appearing or toxic-appearing.  HENT:     Head: Normocephalic and atraumatic.     Right Ear: Tympanic membrane, ear canal and external ear normal. Tympanic membrane is not erythematous, retracted or bulging.     Left Ear: Tympanic membrane, ear canal and external ear normal. Tympanic membrane is not erythematous, retracted or bulging.     Nose: Nose normal.     Right Sinus: No maxillary sinus tenderness or frontal sinus tenderness.     Left Sinus: No maxillary sinus tenderness or frontal sinus tenderness.     Mouth/Throat:     Pharynx: Uvula midline. No posterior oropharyngeal erythema.  Eyes:     General: Lids are normal.     Conjunctiva/sclera: Conjunctivae normal.  Neck:     Trachea: Trachea normal.  Cardiovascular:     Rate and Rhythm: Normal rate and regular rhythm.      Pulses: Normal pulses.     Heart sounds: Normal heart sounds, S1 normal and S2 normal.  Pulmonary:     Effort: Pulmonary effort is normal.     Breath sounds: Normal breath sounds. No decreased breath sounds, wheezing, rhonchi or rales.  Lymphadenopathy:     Cervical: No cervical adenopathy.  Skin:    General: Skin is warm and dry.  Neurological:     Mental Status: He is alert.     GCS: GCS eye subscore is 4. GCS verbal subscore is 5. GCS motor subscore is 6.  Psychiatric:        Speech: Speech normal.        Behavior: Behavior normal. Behavior is cooperative.    Assessment and Plan:   Subacute cough No red flags on exam Suspect lingering cough from prior illness No evidence of infection requiring abx at this time Use tessalon pereles 100 mg twice daily as needed Recommended patient use OTC mucinex x 1 week and Flonase nasal spray  Encouraged patient to push more fluids  Informed patient that cough can take several weeks to resolve fully-- will likely be ordering chest x ray if no improvement of symptoms Follow up if new/no improvement of symptoms, or concerns occur   I,Havlyn C Ratchford,acting as a scribe for [employer], PA.,have documented all relevant documentation on the behalf of Inda Coke, PA,as directed by  Inda Coke, PA while in the presence of Inda Coke, [place].  I, Inda Coke, [place], have reviewed all documentation for this visit. The documentation on 07/05/21 for the exam, diagnosis, procedures, and orders are all accurate and complete.   Inda Coke, PA-C

## 2021-07-05 ENCOUNTER — Other Ambulatory Visit: Payer: Self-pay

## 2021-07-05 ENCOUNTER — Ambulatory Visit (INDEPENDENT_AMBULATORY_CARE_PROVIDER_SITE_OTHER): Payer: Medicare Other | Admitting: Physician Assistant

## 2021-07-05 ENCOUNTER — Encounter: Payer: Self-pay | Admitting: Physician Assistant

## 2021-07-05 VITALS — BP 128/80 | HR 59 | Temp 97.7°F | Ht 68.0 in | Wt 160.2 lb

## 2021-07-05 DIAGNOSIS — R052 Subacute cough: Secondary | ICD-10-CM

## 2021-07-05 MED ORDER — BENZONATATE 100 MG PO CAPS: 100.0000 mg | ORAL_CAPSULE | Freq: Two times a day (BID) | ORAL | 0 refills | Status: DC | PRN

## 2021-07-05 NOTE — Progress Notes (Signed)
Jacob Hood is a 71 y.o. male here for a recurrence of a previously resolved sinus problem.    History of Present Illness:   Chief Complaint  Patient presents with   Cough    Pt c/o lingering cough, dry non-productive at night and when he lays down. Also Head congestion    HPI  Sinus Problem Pt was recently seen for this issue on 06/12/21 where he stated he was experiencing headache, sinus pressure, and cough. Jacob Hood also reported he had experienced ear pain, but this resolved prior to the visit. After testing negative for COVID at that time, he tried taking mucinex and using saline nasal washes which provided minor relief. Due to sx, I prescribed him with azithromycin 250 mg daily and encouraged him to push more fluids.   Currently Jacob Hood returns with c/o continued head congestion and intermittent cough.  He reports that he has completed previously prescribed medication as instructed, but only a couple of days later his head congestion and cough returned. At this time he states that cough is occasional, mainly occurring for a brief amount of time upon lying down. Pt describes that cough can feel like a deep sensation or a simple tickle in his throat. Due to return of sx, he did taken a covid test on 07/02/21 but the results were negative. Additionally he states that his head congestion mainly occurs in the morning, gradually getting better as the day goes on.   In an effort to manage his sx, he has taken Mucinex which has provided relief, but he expresses worry about the lingering cough he has. Denies fever, chills, or post nasal drip.   Past Medical History:  Diagnosis Date   Hyperlipidemia      Social History   Tobacco Use   Smoking status: Former   Smokeless tobacco: Never  Scientific laboratory technician Use: Never used  Substance Use Topics   Alcohol use: Yes    Comment: Socially   Drug use: Never    Past Surgical History:  Procedure Laterality Date   TONSILLECTOMY      Family  History  Problem Relation Age of Onset   Dementia Mother    Heart attack Father    Cancer Neg Hx     Allergies  Allergen Reactions   Penicillins     Current Medications:   Current Outpatient Medications:    calcium-vitamin D (OSCAL WITH D) 500-200 MG-UNIT tablet, Take 1 tablet by mouth., Disp: , Rfl:    Cholecalciferol (VITAMIN D3) 125 MCG (5000 UT) CAPS, , Disp: , Rfl:    co-enzyme Q-10 30 MG capsule, Take 30 mg by mouth 3 (three) times daily., Disp: , Rfl:    Omega-3 Fatty Acids (FISH OIL) 1360 MG CAPS, Take by mouth., Disp: , Rfl:    simvastatin (ZOCOR) 20 MG tablet, Take 1 tablet (20 mg total) by mouth daily., Disp: 90 tablet, Rfl: 3   Review of Systems:   ROS Negative unless otherwise specified per HPI. Vitals:   Vitals:   07/05/21 1029  BP: 128/80  Pulse: (!) 59  Temp: 97.7 F (36.5 C)  TempSrc: Temporal  SpO2: 98%  Weight: 160 lb 4 oz (72.7 kg)  Height: 5\' 8"  (1.727 m)     Body mass index is 24.37 kg/m.  Physical Exam:   Physical Exam Vitals and nursing note reviewed.  Constitutional:      General: He is not in acute distress.    Appearance: He is well-developed. He is  not ill-appearing or toxic-appearing.  HENT:     Head: Normocephalic and atraumatic.     Right Ear: Tympanic membrane, ear canal and external ear normal. Tympanic membrane is not erythematous, retracted or bulging.     Left Ear: Tympanic membrane, ear canal and external ear normal. Tympanic membrane is not erythematous, retracted or bulging.     Nose: Nose normal.     Right Sinus: No maxillary sinus tenderness or frontal sinus tenderness.     Left Sinus: No maxillary sinus tenderness or frontal sinus tenderness.     Mouth/Throat:     Pharynx: Uvula midline. No posterior oropharyngeal erythema.  Eyes:     General: Lids are normal.     Conjunctiva/sclera: Conjunctivae normal.  Neck:     Trachea: Trachea normal.  Cardiovascular:     Rate and Rhythm: Normal rate and regular rhythm.      Pulses: Normal pulses.     Heart sounds: Normal heart sounds, S1 normal and S2 normal.  Pulmonary:     Effort: Pulmonary effort is normal.     Breath sounds: Normal breath sounds. No decreased breath sounds, wheezing, rhonchi or rales.  Lymphadenopathy:     Cervical: No cervical adenopathy.  Skin:    General: Skin is warm and dry.  Neurological:     Mental Status: He is alert.     GCS: GCS eye subscore is 4. GCS verbal subscore is 5. GCS motor subscore is 6.  Psychiatric:        Speech: Speech normal.        Behavior: Behavior normal. Behavior is cooperative.    Assessment and Plan:   Subacute cough No red flags on exam Suspect lingering cough from prior illness No evidence of infection requiring abx at this time Use tessalon pereles 100 mg twice daily as needed Recommended patient use OTC mucinex x 1 week and Flonase nasal spray  Encouraged patient to push more fluids  Informed patient that cough can take several weeks to resolve fully-- will likely be ordering chest x ray if no improvement of symptoms Follow up if new/no improvement of symptoms, or concerns occur   I,Havlyn C Ratchford,acting as a scribe for [employer], PA.,have documented all relevant documentation on the behalf of Inda Coke, PA,as directed by  Inda Coke, PA while in the presence of Inda Coke, [place].  I, Inda Coke, [place], have reviewed all documentation for this visit. The documentation on 07/05/21 for the exam, diagnosis, procedures, and orders are all accurate and complete.   Inda Coke, PA-C

## 2021-07-05 NOTE — Patient Instructions (Signed)
It was great to see you!  You have a lingering cough from your prior illness. Antibiotics are not needed for this.  The cough can last a several weeks to go away.  Use medication as prescribed: tessalon perles cough capsules  Consider more regular Mucinex, extra fluids and regular use of flonase.  Push fluids and get plenty of rest. Please return if you are not improving as expected, next step would likely be ordering a chest xray.  Call clinic with questions.  I hope you start feeling better soon!

## 2021-08-07 DIAGNOSIS — H524 Presbyopia: Secondary | ICD-10-CM | POA: Diagnosis not present

## 2021-08-07 DIAGNOSIS — H2513 Age-related nuclear cataract, bilateral: Secondary | ICD-10-CM | POA: Diagnosis not present

## 2021-08-12 ENCOUNTER — Encounter: Payer: Self-pay | Admitting: Family Medicine

## 2021-08-13 NOTE — Telephone Encounter (Signed)
See note

## 2021-08-14 NOTE — Telephone Encounter (Signed)
I indicated in my note that this is not a significant issue and that we did not need to do any further management. Can we clarify with him what we need to do to help him. Is there a form for [place] to fill out?  Algis Greenhouse. Jerline Pain, MD 08/14/2021 7:50 AM

## 2021-08-15 NOTE — Telephone Encounter (Signed)
Please advise if you want him to schedule an appt to discuss this further.

## 2021-09-11 ENCOUNTER — Other Ambulatory Visit: Payer: Self-pay

## 2021-09-11 ENCOUNTER — Telehealth: Payer: Self-pay | Admitting: Family Medicine

## 2021-09-11 ENCOUNTER — Ambulatory Visit (INDEPENDENT_AMBULATORY_CARE_PROVIDER_SITE_OTHER): Payer: Medicare Other | Admitting: Family Medicine

## 2021-09-11 VITALS — BP 148/76 | HR 54 | Temp 97.8°F | Ht 68.0 in | Wt 157.8 lb

## 2021-09-11 DIAGNOSIS — Z23 Encounter for immunization: Secondary | ICD-10-CM | POA: Diagnosis not present

## 2021-09-11 DIAGNOSIS — E785 Hyperlipidemia, unspecified: Secondary | ICD-10-CM | POA: Diagnosis not present

## 2021-09-11 DIAGNOSIS — R351 Nocturia: Secondary | ICD-10-CM | POA: Diagnosis not present

## 2021-09-11 DIAGNOSIS — R739 Hyperglycemia, unspecified: Secondary | ICD-10-CM

## 2021-09-11 DIAGNOSIS — R55 Syncope and collapse: Secondary | ICD-10-CM | POA: Diagnosis not present

## 2021-09-11 DIAGNOSIS — R030 Elevated blood-pressure reading, without diagnosis of hypertension: Secondary | ICD-10-CM

## 2021-09-11 LAB — COMPREHENSIVE METABOLIC PANEL
ALT: 18 U/L (ref 0–53)
AST: 23 U/L (ref 0–37)
Albumin: 4.5 g/dL (ref 3.5–5.2)
Alkaline Phosphatase: 31 U/L — ABNORMAL LOW (ref 39–117)
BUN: 17 mg/dL (ref 6–23)
CO2: 30 mEq/L (ref 19–32)
Calcium: 9.6 mg/dL (ref 8.4–10.5)
Chloride: 104 mEq/L (ref 96–112)
Creatinine, Ser: 1.17 mg/dL (ref 0.40–1.50)
GFR: 62.16 mL/min (ref >60.00)
Glucose, Bld: 69 mg/dL — ABNORMAL LOW (ref 70–99)
Potassium: 4.7 mEq/L (ref 3.5–5.1)
Sodium: 142 mEq/L (ref 135–145)
Total Bilirubin: 0.7 mg/dL (ref 0.2–1.2)
Total Protein: 6.7 g/dL (ref 6.0–8.3)

## 2021-09-11 LAB — PSA: PSA: 0.95 ng/mL (ref 0.10–4.00)

## 2021-09-11 LAB — CBC
HCT: 41.9 % (ref 39.0–52.0)
Hemoglobin: 13.9 g/dL (ref 13.0–17.0)
MCHC: 33.1 g/dL (ref 30.0–36.0)
MCV: 92.7 fl (ref 78.0–100.0)
Platelets: 191 10*3/uL (ref 150.0–400.0)
RBC: 4.52 Mil/uL (ref 4.22–5.81)
RDW: 14.6 % (ref 11.5–15.5)
WBC: 4.3 10*3/uL (ref 4.0–10.5)

## 2021-09-11 LAB — LIPID PANEL
Cholesterol: 166 mg/dL (ref 0–200)
HDL: 49.5 mg/dL (ref >39.00)
NonHDL: 116.52
Total CHOL/HDL Ratio: 3
Triglycerides: 203 mg/dL — ABNORMAL HIGH (ref 0.0–149.0)
VLDL: 40.6 mg/dL — ABNORMAL HIGH (ref 0.0–40.0)

## 2021-09-11 LAB — HEMOGLOBIN A1C: Hgb A1c MFr Bld: 6 % (ref 4.6–6.5)

## 2021-09-11 LAB — TSH: TSH: 1.97 u[IU]/mL (ref 0.35–5.50)

## 2021-09-11 LAB — LDL CHOLESTEROL, DIRECT: Direct LDL: 83 mg/dL

## 2021-09-11 MED ORDER — KETOCONAZOLE-HYDROCORTISONE 2-2.5 % EX CREA: TOPICAL_CREAM | CUTANEOUS | 0 refills | Status: DC

## 2021-09-11 MED ORDER — SIMVASTATIN 20 MG PO TABS: 20.0000 mg | ORAL_TABLET | Freq: Every day | ORAL | 3 refills | Status: DC

## 2021-09-11 NOTE — Assessment & Plan Note (Signed)
At goal per JNC 8. ?

## 2021-09-11 NOTE — Assessment & Plan Note (Signed)
Check lipids.  He is on simvastatin 20 mg daily. ?

## 2021-09-11 NOTE — Telephone Encounter (Signed)
[facility]- [facility] do not have Ketoconazole-Hydrocortisone nor do they make them . Patients RX to be sent somewhere else. Pharmacy number [phone]  ?

## 2021-09-11 NOTE — Telephone Encounter (Signed)
Per pt sent to [facility] to be filled.  ?

## 2021-09-11 NOTE — Addendum Note (Signed)
Addended by: Verlon Setting on: 09/11/2021 09:15 AM ? ? Modules accepted: Orders ? ?

## 2021-09-11 NOTE — Patient Instructions (Signed)
It was very nice to see you today! ? ?Please try the cream for your rash and let me know if not improving in the next 1 to 2 weeks. ? ?I think you have dried blood in your toenail.  This should continue to improve over the next several months. ? ?We will check blood work today. ? ?We will give you your pneumonia vaccine today.  ? ?Please come back in 1 year for your next visit.  Come back sooner if needed. ? ?Take care, ?Dr Jerline Pain ? ?PLEASE NOTE: ? ?If you had any lab tests please let us know if you have not heard back within a few days. You may see your results on mychart before we have a chance to review them but we will give you a call once they are reviewed by [place]. If we ordered any referrals today, please let us know if you have not heard from their office within the next week.  ? ?Please try these tips to maintain a healthy lifestyle: ? ?Eat at least 3 REAL meals and 1-2 snacks per day.  Aim for no more than 5 hours between eating.  If you eat breakfast, please do so within one hour of getting up.  ? ?Each meal should contain half fruits/vegetables, one quarter protein, and one quarter carbs (no bigger than a computer mouse) ? ?Cut down on sweet beverages. This includes juice, soda, and sweet tea.  ? ?Drink at least 1 glass of water with each meal and aim for at least 8 glasses per day ? ?Exercise at least 150 minutes every week.   ? ?Preventive Care 61 Years and Older, Male ?Preventive care refers to lifestyle choices and visits with your health care provider that can promote health and wellness. Preventive care visits are also called wellness exams. ?What can I expect for my preventive care visit? ?Counseling ?During your preventive care visit, your health care provider may ask about your: ?Medical history, including: ?Past medical problems. ?Family medical history. ?History of falls. ?Current health, including: ?Emotional well-being. ?Home life and relationship well-being. ?Sexual activity. ?Memory and  ability to understand (cognition). ?Lifestyle, including: ?Alcohol, nicotine or tobacco, and drug use. ?Access to firearms. ?Diet, exercise, and sleep habits. ?Work and work Statistician. ?Sunscreen use. ?Safety issues such as seatbelt and bike helmet use. ?Physical exam ?Your health care provider will check your: ?Height and weight. These may be used to calculate your BMI (body mass index). BMI is a measurement that tells if you are at a healthy weight. ?Waist circumference. This measures the distance around your waistline. This measurement also tells if you are at a healthy weight and may help predict your risk of certain diseases, such as type 2 diabetes and high blood pressure. ?Heart rate and blood pressure. ?Body temperature. ?Skin for abnormal spots. ?What immunizations do I need? ?Vaccines are usually given at various ages, according to a schedule. Your health care provider will recommend vaccines for you based on your age, medical history, and lifestyle or other factors, such as travel or where you work. ?What tests do I need? ?Screening ?Your health care provider may recommend screening tests for certain conditions. This may include: ?Lipid and cholesterol levels. ?Diabetes screening. This is done by checking your blood sugar (glucose) after you have not eaten for a while (fasting). ?Hepatitis C test. ?Hepatitis B test. ?HIV (human immunodeficiency virus) test. ?STI (sexually transmitted infection) testing, if you are at risk. ?Lung cancer screening. ?Colorectal cancer screening. ?Prostate cancer screening. ?Abdominal  aortic aneurysm (AAA) screening. You may need this if you are a current or former smoker. ?Talk with your health care provider about your test results, treatment options, and if necessary, the need for more tests. ?Follow these instructions at home: ?Eating and drinking ? ?Eat a diet that includes fresh fruits and vegetables, whole grains, lean protein, and low-fat dairy products. Limit your  intake of foods with high amounts of sugar, saturated fats, and salt. ?Take vitamin and mineral supplements as recommended by your health care provider. ?Do not drink alcohol if your health care provider tells you not to drink. ?If you drink alcohol: ?Limit how much you have to 0-2 drinks a day. ?Know how much alcohol is in your drink. In the [place] one drink equals one 12 oz bottle of beer (355 mL), one 5 oz glass of wine (148 mL), or one 1? oz glass of hard liquor (44 mL). ?Lifestyle ?Brush your teeth every morning and night with fluoride toothpaste. Floss one time each day. ?Exercise for at least 30 minutes 5 or more days each week. ?Do not use any products that contain nicotine or tobacco. These products include cigarettes, chewing tobacco, and vaping devices, such as e-cigarettes. If you need help quitting, ask your health care provider. ?Do not use drugs. ?If you are sexually active, practice safe sex. Use a condom or other form of protection to prevent STIs. ?Take aspirin only as told by your health care provider. Make sure that you understand how much to take and what form to take. Work with your health care provider to find out whether it is safe and beneficial for you to take aspirin daily. ?Ask your health care provider if you need to take a cholesterol-lowering medicine (statin). ?Find healthy ways to manage stress, such as: ?Meditation, yoga, or listening to music. ?Journaling. ?Talking to a trusted person. ?Spending time with friends and family. ?Safety ?Always wear your seat belt while driving or riding in a vehicle. ?Do not drive: ?If you have been drinking alcohol. Do not ride with someone who has been drinking. ?When you are tired or distracted. ?While texting. ?If you have been using any mind-altering substances or drugs. ?Wear a helmet and other protective equipment during sports activities. ?If you have firearms in your house, make sure you follow all gun safety procedures. ?Minimize exposure to  UV radiation to reduce your risk of skin cancer. ?What's next? ?Visit your health care provider once a year for an annual wellness visit. ?Ask your health care provider how often you should have your eyes and teeth checked. ?Stay up to date on all vaccines. ?This information is not intended to replace advice given to you by your health care provider. Make sure you discuss any questions you have with your health care provider. ?Document Revised: 12/06/2020 Document Reviewed: 12/06/2020 ?Elsevier Patient Education ? [employer]. ? ?

## 2021-09-11 NOTE — Progress Notes (Signed)
? ?Chief Complaint:  ?Jacob Hood is a 71 y.o. male who presents today for his annual comprehensive physical exam.   ? ?Assessment/Plan:  ?New/Acute Problems: ?Rash ?Likely tinea infection based on appearance.  We will start topical ketoconazole with hydrocortisone.  He will let me know if not improving in the next couple of weeks. May need biopsy if not improving. ? ?Pigmented Toenail Lesion ?Likely resolving subungual hematoma.  Lesion is clear of about 3-58m from the base of his nail and he has no signs concerning for melanoma.  We discussed elective nail avulsion for definitive diagnosis however deferred for now.  Continue with watchful waiting.  Discussed reasons to return to care. ? ?Chronic Problems Addressed Today: ?Syncope ?Patient with remote history of heat related syncope.  He has not had any issues within the last couple of years.  Do not need to do any further work-up or management at this point. ? ?Elevated blood pressure reading ?At goal per [facility] 8. ? ?Dyslipidemia ?Check lipids.  He is on simvastatin 20 mg daily. ? ?Preventative Healthcare: ?Check labs. Pneumonia vaccine given today. UTD on other vaccines and screenings. ? ?Patient Counseling(The following topics were reviewed and/or handout was given): ? -Nutrition: Stressed importance of moderation in sodium/caffeine intake, saturated fat and cholesterol, caloric balance, sufficient intake of fresh fruits, vegetables, and fiber. ? -Stressed the importance of regular exercise.  ? -Substance Abuse: Discussed cessation/primary prevention of tobacco, alcohol, or other drug use; driving or other dangerous activities under the influence; availability of treatment for abuse.  ? -Injury prevention: Discussed safety belts, safety helmets, smoke detector, smoking near bedding or upholstery.  ? -Sexuality: Discussed sexually transmitted diseases, partner selection, use of condoms, avoidance of unintended pregnancy and contraceptive alternatives.  ?  -Dental health: Discussed importance of regular tooth brushing, flossing, and dental visits. ? -Health maintenance and immunizations reviewed. Please refer to Health maintenance section. ? ?Return to care in 1 year for next preventative visit.  ? ?  ?Subjective:  ?HPI: ? ?He has no acute complaints today.  ? ?Patient here with skin lesion. This been there for a while. Located on right arm. He notes this gets flaky sometimes. He is concerned this might be ringworm. Had similar issue few years ago. He notes he has tried some cream in the past but he not sure with the name. This has helped. He notes he went to urgent care as well. Denies itch or pain. No treatment tried at this time. ? ?He also complain of spot on his right great toenail. This been there for couple of months. He has noticed some discoloration and shape change. Located under nail folds. He noticed sometimes this move. No treatment tried. No injuries. No obvious aggravating or precipitating factors. Denies any pain. No swelling.  No purulent discharge. ? ?He is requesting refill on Simvastatin 20 mg daily. He is tolerating his medication well. No side effects. ? ? ?Lifestyle ?Diet: None specific.  ?Exercise: None specific. ? ?[facility]  ?Decreased Interest 0  ?Down, Depressed, Hopeless 0  ?PHQ - 2 Score 0  ?Altered sleeping -  ?Tired, decreased energy -  ?Change in appetite -  ?Feeling bad or failure about yourself  -  ?Trouble concentrating -  ?Moving slowly or fidgety/restless -  ?Suicidal thoughts -  ?PHQ-9 Score -  ? ? ?Health Maintenance Due  ?Topic Date Due  ? Hepatitis C Screening  Never done  ? Pneumonia Vaccine 71 Years old (1 - PCV) Never  done  ?  ? ?ROS: Per HPI, otherwise a complete review of systems was negative.  ? ?PMH: ? ?The following were reviewed and entered/updated in epic: ?Past Medical History:  ?Diagnosis Date  ? Hyperlipidemia   ? ?Patient Active Problem List  ? Diagnosis Date Noted  ? Sesamoiditis of  left foot 06/12/2021  ? Syncope 09/08/2020  ? Impingement syndrome of right wrist 02/25/2020  ? Elevated blood pressure reading 09/08/2019  ? Dyslipidemia 11/24/2018  ? ?Past Surgical History:  ?Procedure Laterality Date  ? TONSILLECTOMY    ? ? ?Family History  ?Problem Relation Age of Onset  ? Dementia Mother   ? Heart attack Father   ? Cancer Neg Hx   ? ? ?Medications- reviewed and updated ?Current Outpatient Medications  ?Medication Sig Dispense Refill  ? calcium-vitamin D (OSCAL WITH D) 500-200 MG-UNIT tablet Take 1 tablet by mouth.    ? Cholecalciferol (VITAMIN D3) 125 MCG (5000 UT) CAPS     ? co-enzyme Q-10 30 MG capsule Take 30 mg by mouth 3 (three) times daily.    ? Ketoconazole-Hydrocortisone 2-2.5 % CREA Use twice daily for 2 weeks. 30 g 0  ? Omega-3 Fatty Acids (FISH OIL) 1360 MG CAPS Take by mouth.    ? simvastatin (ZOCOR) 20 MG tablet Take 1 tablet (20 mg total) by mouth daily. 90 tablet 3  ? ?No current facility-administered medications for this visit.  ? ? ?Allergies-reviewed and updated ?Allergies  ?Allergen Reactions  ? Penicillins   ? ? ?Social History  ? ?Socioeconomic History  ? Marital status: Married  ?  Spouse name: Not on file  ? Number of children: Not on file  ? Years of education: Not on file  ? Highest education level: Not on file  ?Occupational History  ? Not on file  ?Tobacco Use  ? Smoking status: Former  ? Smokeless tobacco: Never  ?Vaping Use  ? Vaping Use: Never used  ?Substance and Sexual Activity  ? Alcohol use: Yes  ?  Comment: Socially  ? Drug use: Never  ? Sexual activity: Yes  ?Other Topics Concern  ? Not on file  ?Social History Narrative  ? Not on file  ? ?Social Determinants of Health  ? ?Financial Resource Strain: Not on file  ?Food Insecurity: Not on file  ?Transportation Needs: Not on file  ?Physical Activity: Not on file  ?Stress: Not on file  ?Social Connections: Not on file  ? ?   ?  ?Objective:  ?Physical Exam: ?BP (!) 148/76 (BP Location: Left Arm)   Pulse (!) 54    Temp 97.8 ?F (36.6 ?C) (Temporal)   Ht '5\' 8"'$  (1.727 m)   Wt 157 lb 12.8 oz (71.6 kg)   SpO2 98%   BMI 23.99 kg/m?   ?Body mass index is 23.99 kg/m?. ?Wt Readings from Last 3 Encounters:  ?09/11/21 157 lb 12.8 oz (71.6 kg)  ?07/05/21 160 lb 4 oz (72.7 kg)  ?06/12/21 161 lb (73 kg)  ? ?Gen: NAD, resting comfortably ?HEENT: TMs normal bilaterally. OP clear. No thyromegaly noted.  ?CV: RRR with no murmurs appreciated ?Pulm: NWOB, CTAB with no crackles, wheezes, or rhonchi ?GI: Normal bowel sounds present. Soft, Nontender, Nondistended. ?MSK: no edema, cyanosis, or clubbing noted ?Skin: warm, dry.  Approximately 1 cm erythematous raised lesion on right arm.  Hyperpigmented lesion 2 x 5 mm on medial aspect of right great toenail not involving the nail base.  ?Neuro: CN2-12 grossly intact. Strength 5/5 in upper and  lower extremities. Reflexes symmetric and intact bilaterally.  ?Psych: Normal affect and thought content ?   ? ? ?I,Savera Zaman,acting as a Education administrator for Dimas Chyle, MD.,have documented all relevant documentation on the behalf of Dimas Chyle, MD,as directed by  Dimas Chyle, MD while in the presence of Dimas Chyle, MD.  ? ?I, Dimas Chyle, MD, have reviewed all documentation for this visit. The documentation on 09/11/21 for the exam, diagnosis, procedures, and orders are all accurate and complete. ? ?Algis Greenhouse. Jerline Pain, MD ?09/11/2021 8:53 AM  ? ?

## 2021-09-11 NOTE — Assessment & Plan Note (Signed)
Patient with remote history of heat related syncope.  He has not had any issues within the last couple of years.  Do not need to do any further work-up or management at this point. ?

## 2021-09-12 ENCOUNTER — Encounter: Payer: Self-pay | Admitting: Family Medicine

## 2021-09-12 NOTE — Telephone Encounter (Signed)
See note

## 2021-09-13 NOTE — Progress Notes (Signed)
Please inform patient of the following: ? ?His blood sugar  and cholesterol are borderline but everything else is stable. Do not need to make any changes to his treatment plan at this time. He should continue to work on diet and exercise and we can recheck in a year or so. ? ?Jacob Hood. Jerline Pain, MD ?09/13/2021 12:55 PM  ?

## 2021-09-13 NOTE — Telephone Encounter (Signed)
He should mix them and apply both at the same time. ? ?Algis Greenhouse. Jerline Pain, MD ?09/13/2021 8:03 AM  ? ?

## 2021-10-19 ENCOUNTER — Ambulatory Visit (INDEPENDENT_AMBULATORY_CARE_PROVIDER_SITE_OTHER): Payer: Medicare Other

## 2021-10-19 DIAGNOSIS — Z0000 Encounter for general adult medical examination without abnormal findings: Secondary | ICD-10-CM | POA: Diagnosis not present

## 2021-10-19 NOTE — Patient Instructions (Addendum)
Jacob Hood , ?Thank you for taking time to come for your Medicare Wellness Visit. I appreciate your ongoing commitment to your health goals. Please review the following plan we discussed and let me know if I can assist you in the future.  ? ?Screening recommendations/referrals: ?Colonoscopy: Done 08/08/14 repeat every 10 years  ?Recommended yearly ophthalmology/optometry visit for glaucoma screening and checkup ?Recommended yearly dental visit for hygiene and checkup ? ?Vaccinations: ?Influenza vaccine: Done 04/12/21 repeat every year  ?Pneumococcal vaccine: Up to date ?Tdap vaccine: Done 08/26/18 repeat every 10 years  ?Shingles vaccine: Completed 1st dose 01/28/19   ?Covid-19: Completed 1/19, 2/9, 03/28/20 & 10/08/20 ? ?Advanced directives: Please bring a copy of your health care power of attorney and living will to the office at your convenience. ? ?Conditions/risks identified: Maintain health  ? ?Next appointment: Follow up in one year for your annual wellness visit.  ? ?Preventive Care 21 Years and Older, Male ?Preventive care refers to lifestyle choices and visits with your health care provider that can promote health and wellness. ?What does preventive care include? ?A yearly physical exam. This is also called an annual well check. ?Dental exams once or twice a year. ?Routine eye exams. Ask your health care provider how often you should have your eyes checked. ?Personal lifestyle choices, including: ?Daily care of your teeth and gums. ?Regular physical activity. ?Eating a healthy diet. ?Avoiding tobacco and drug use. ?Limiting alcohol use. ?Practicing safe sex. ?Taking low doses of aspirin every day. ?Taking vitamin and mineral supplements as recommended by your health care provider. ?What happens during an annual well check? ?The services and screenings done by your health care provider during your annual well check will depend on your age, overall health, lifestyle risk factors, and family history of  disease. ?Counseling  ?Your health care provider may ask you questions about your: ?Alcohol use. ?Tobacco use. ?Drug use. ?Emotional well-being. ?Home and relationship well-being. ?Sexual activity. ?Eating habits. ?History of falls. ?Memory and ability to understand (cognition). ?Work and work Statistician. ?Screening  ?You may have the following tests or measurements: ?Height, weight, and BMI. ?Blood pressure. ?Lipid and cholesterol levels. These may be checked every 5 years, or more frequently if you are over 60 years old. ?Skin check. ?Lung cancer screening. You may have this screening every year starting at age 59 if you have a 30-pack-year history of smoking and currently smoke or have quit within the past 15 years. ?Fecal occult blood test (FOBT) of the stool. You may have this test every year starting at age 43. ?Flexible sigmoidoscopy or colonoscopy. You may have a sigmoidoscopy every 5 years or a colonoscopy every 10 years starting at age 22. ?Prostate cancer screening. Recommendations will vary depending on your family history and other risks. ?Hepatitis C blood test. ?Hepatitis B blood test. ?Sexually transmitted disease (STD) testing. ?Diabetes screening. This is done by checking your blood sugar (glucose) after you have not eaten for a while (fasting). You may have this done every 1-3 years. ?Abdominal aortic aneurysm (AAA) screening. You may need this if you are a current or former smoker. ?Osteoporosis. You may be screened starting at age 57 if you are at high risk. ?Talk with your health care provider about your test results, treatment options, and if necessary, the need for more tests. ?Vaccines  ?Your health care provider may recommend certain vaccines, such as: ?Influenza vaccine. This is recommended every year. ?Tetanus, diphtheria, and acellular pertussis (Tdap, Td) vaccine. You may need a Td  booster every 10 years. ?Zoster vaccine. You may need this after age 23. ?Pneumococcal 13-valent  conjugate (PCV13) vaccine. One dose is recommended after age 17. ?Pneumococcal polysaccharide (PPSV23) vaccine. One dose is recommended after age 15. ?Talk to your health care provider about which screenings and vaccines you need and how often you need them. ?This information is not intended to replace advice given to you by your health care provider. Make sure you discuss any questions you have with your health care provider. ?Document Released: 07/07/2015 Document Revised: 02/28/2016 Document Reviewed: 04/11/2015 ?Elsevier Interactive Patient Education ? 2017 [place]. ? ?Fall Prevention in the Home ?Falls can cause injuries. They can happen to people of all ages. There are many things you can do to make your home safe and to help prevent falls. ?What can I do on the outside of my home? ?Regularly fix the edges of walkways and driveways and fix any cracks. ?Remove anything that might make you trip as you walk through a door, such as a raised step or threshold. ?Trim any bushes or trees on the path to your home. ?Use bright outdoor lighting. ?Clear any walking paths of anything that might make someone trip, such as rocks or tools. ?Regularly check to see if handrails are loose or broken. Make sure that both sides of any steps have handrails. ?Any raised decks and porches should have guardrails on the edges. ?Have any leaves, snow, or ice cleared regularly. ?Use sand or salt on walking paths during winter. ?Clean up any spills in your garage right away. This includes oil or grease spills. ?What can I do in the bathroom? ?Use night lights. ?Install grab bars by the toilet and in the tub and shower. Do not use towel bars as grab bars. ?Use non-skid mats or decals in the tub or shower. ?If you need to sit down in the shower, use a plastic, non-slip stool. ?Keep the floor dry. Clean up any water that spills on the floor as soon as it happens. ?Remove soap buildup in the tub or shower regularly. ?Attach bath mats  securely with double-sided non-slip rug tape. ?Do not have throw rugs and other things on the floor that can make you trip. ?What can I do in the bedroom? ?Use night lights. ?Make sure that you have a light by your bed that is easy to reach. ?Do not use any sheets or blankets that are too big for your bed. They should not hang down onto the floor. ?Have a firm chair that has side arms. You can use this for support while you get dressed. ?Do not have throw rugs and other things on the floor that can make you trip. ?What can I do in the kitchen? ?Clean up any spills right away. ?Avoid walking on wet floors. ?Keep items that you use a lot in easy-to-reach places. ?If you need to reach something above you, use a strong step stool that has a grab bar. ?Keep electrical cords out of the way. ?Do not use floor polish or wax that makes floors slippery. If you must use wax, use non-skid floor wax. ?Do not have throw rugs and other things on the floor that can make you trip. ?What can I do with my stairs? ?Do not leave any items on the stairs. ?Make sure that there are handrails on both sides of the stairs and use them. Fix handrails that are broken or loose. Make sure that handrails are as long as the stairways. ?Check any carpeting  to make sure that it is firmly attached to the stairs. Fix any carpet that is loose or worn. ?Avoid having throw rugs at the top or bottom of the stairs. If you do have throw rugs, attach them to the floor with carpet tape. ?Make sure that you have a light switch at the top of the stairs and the bottom of the stairs. If you do not have them, ask someone to add them for you. ?What else can I do to help prevent falls? ?Wear shoes that: ?Do not have high heels. ?Have rubber bottoms. ?Are comfortable and fit you well. ?Are closed at the toe. Do not wear sandals. ?If you use a stepladder: ?Make sure that it is fully opened. Do not climb a closed stepladder. ?Make sure that both sides of the stepladder  are locked into place. ?Ask someone to hold it for you, if possible. ?Clearly mark and make sure that you can see: ?Any grab bars or handrails. ?First and last steps. ?Where the edge of each step is. ?Use tools that help

## 2021-10-19 NOTE — Progress Notes (Signed)
Virtual Visit via Telephone Note ? ?I connected with  Jacob Hood on 10/19/21 at 12:00 PM EDT by telephone and verified that I am speaking with the correct person using two identifiers. ? ?Medicare Annual Wellness visit completed telephonically due to Covid-19 pandemic.  ? ?Persons participating in this call: This Health Coach and this patient.  ? ?Location: ?Patient: Home ?Provider: Office  ?  ?I discussed the limitations, risks, security and privacy concerns of performing an evaluation and management service by telephone and the availability of in person appointments. The patient expressed understanding and agreed to proceed. ? ?Unable to perform video visit due to video visit attempted and failed and/or patient does not have video capability.  ? ?Some vital signs may be absent or patient reported.  ? ?Willette Brace, LPN ? ? ?Subjective:  ? Jacob Hood is a 71 y.o. male who presents for Medicare Annual/Subsequent preventive examination. ? ?Review of Systems    ? ?Cardiac Risk Factors include: advanced age (>66mn, >>91women);dyslipidemia;male gender ? ?   ?Objective:  ?  ?There were no vitals filed for this visit. ?There is no height or weight on file to calculate BMI. ? ? ?  10/19/2021  ? 10:45 AM 09/14/2020  ?  2:25 PM  ?Advanced Directives  ?Does Patient Have a Medical Advance Directive? Yes Yes  ?Type of [place] Healthcare Power of Attorney  ?Does patient want to make changes to medical advance directive?  No - Patient declined  ?Copy of [place]? No - copy requested No - copy requested  ? ? ?Current Medications (verified) ?Outpatient Encounter Medications as of 10/19/2021  ?Medication Sig  ? calcium-vitamin D (OSCAL WITH D) 500-200 MG-UNIT tablet Take 1 tablet by mouth.  ? Cholecalciferol (VITAMIN D3) 125 MCG (5000 UT) CAPS   ? co-enzyme Q-10 30 MG capsule Take 30 mg by mouth 3 (three) times daily.  ? Omega-3 Fatty Acids  (FISH OIL) 1360 MG CAPS Take by mouth.  ? simvastatin (ZOCOR) 20 MG tablet Take 1 tablet (20 mg total) by mouth daily.  ? hydrocortisone 2.5 % cream Apply topically 2 (two) times daily. (Patient not taking: Reported on 10/19/2021)  ? Ketoconazole-Hydrocortisone 2-2.5 % CREA Use twice daily for 2 weeks. (Patient not taking: Reported on 10/19/2021)  ? ?No facility-administered encounter medications on file as of 10/19/2021.  ? ? ?Allergies (verified) ?Penicillins  ? ?History: ?Past Medical History:  ?Diagnosis Date  ? Hyperlipidemia   ? ?Past Surgical History:  ?Procedure Laterality Date  ? TONSILLECTOMY    ? ?Family History  ?Problem Relation Age of Onset  ? Dementia Mother   ? Heart attack Father   ? Cancer Neg Hx   ? ?Social History  ? ?Socioeconomic History  ? Marital status: Married  ?  Spouse name: Not on file  ? Number of children: Not on file  ? Years of education: Not on file  ? Highest education level: Not on file  ?Occupational History  ? Not on file  ?Tobacco Use  ? Smoking status: Former  ? Smokeless tobacco: Never  ?Vaping Use  ? Vaping Use: Never used  ?Substance and Sexual Activity  ? Alcohol use: Yes  ?  Comment: Socially  ? Drug use: Never  ? Sexual activity: Yes  ?Other Topics Concern  ? Not on file  ?Social History Narrative  ? Not on file  ? ?Social Determinants of Health  ? ?Financial Resource Strain: Low  Risk   ? Difficulty of Paying Living Expenses: Not hard at all  ?Food Insecurity: No Food Insecurity  ? Worried About Charity fundraiser in the Last Year: Never true  ? Ran Out of Food in the Last Year: Never true  ?Transportation Needs: No Transportation Needs  ? Lack of Transportation (Medical): No  ? Lack of Transportation (Non-Medical): No  ?Physical Activity: Sufficiently Active  ? Days of Exercise per Week: 3 days  ? Minutes of Exercise per Session: 60 min  ?Stress: No Stress Concern Present  ? Feeling of Stress : Not at all  ?Social Connections: Moderately Isolated  ? Frequency of  Communication with Friends and Family: More than three times a week  ? Frequency of Social Gatherings with Friends and Family: More than three times a week  ? Attends Religious Services: Never  ? Active Member of Clubs or Organizations: No  ? Attends Archivist Meetings: Never  ? Marital Status: Married  ? ? ?Tobacco Counseling ?Counseling given: Not Answered ? ? ?Clinical Intake: ? ?Pre-visit preparation completed: Yes ? ?Pain : No/denies pain ? ?  ? ?BMI - recorded: 24 ?Nutritional Status: BMI of 19-24  Normal ?Nutritional Risks: None ?Diabetes: No ? ?How often do you need to have someone help you when you read instructions, pamphlets, or other written materials from your doctor or pharmacy?: 1 - Never ? ?Diabetic?no ? ?Interpreter Needed?: No ? ?Information entered by :: Charlott Rakes, LPN ? ? ?Activities of Daily Living ? ?  10/19/2021  ? 10:46 AM  ?In your present state of health, do you have any difficulty performing the following activities:  ?Hearing? 0  ?Vision? 0  ?Difficulty concentrating or making decisions? 0  ?Walking or climbing stairs? 0  ?Dressing or bathing? 0  ?Doing errands, shopping? 0  ?Preparing Food and eating ? N  ?Using the Toilet? N  ?In the past six months, have you accidently leaked urine? N  ?Do you have problems with loss of bowel control? N  ?Managing your Medications? N  ?Managing your Finances? N  ?Housekeeping or managing your Housekeeping? N  ? ? ?Patient Care Team: ?Vivi Barrack, MD as PCP - General (Family Medicine) ? ?Indicate any recent Medical Services you may have received from other than [facility] providers in the past year (date may be approximate). ? ?   ?Assessment:  ? This is a routine wellness examination for [name]. ? ?Hearing/Vision screen ?Hearing Screening - Comments:: Pt denies any hearing issues  ?Vision Screening - Comments:: Pt follows up with Dr Valetta Close for annual eye exams  ? ?Dietary issues and exercise activities discussed: ?Current Exercise Habits:  Home exercise routine, Type of exercise: walking;yoga;strength training/weights, Time (Minutes): 60, Frequency (Times/Week): 3, Weekly Exercise (Minutes/Week): 180 ? ? Goals Addressed   ? ?  ?  ?  ?  ? This Visit's Progress  ?  Patient Stated     ?  Maintain healthy state ?  ? ?  ? ?Depression Screen ? ?  10/19/2021  ? 10:43 AM 09/11/2021  ?  8:22 AM 09/08/2020  ?  8:15 AM 09/08/2019  ?  9:18 AM 11/24/2018  ? 10:02 AM  ?PHQ 2/9 Scores  ?PHQ - 2 Score 0 0 0 0 0  ?PHQ- 9 Score    0   ?  ?Fall Risk ? ?  10/19/2021  ? 10:45 AM 09/11/2021  ?  8:21 AM  ?Fall Risk   ?Falls in the past year? 0  0  ?Number falls in past yr: 0 0  ?Injury with Fall? 0 0  ?Risk for fall due to : Impaired vision No Fall Risks  ?Follow up Falls prevention discussed Falls evaluation completed  ? ? ?FALL RISK PREVENTION PERTAINING TO THE HOME: ? ?Any stairs in or around the home? Yes  ?If so, are there any without handrails? No  ?Home free of loose throw rugs in walkways, pet beds, electrical cords, etc? Yes  ?Adequate lighting in your home to reduce risk of falls? Yes  ? ?ASSISTIVE DEVICES UTILIZED TO PREVENT FALLS: ? ?Life alert? No  ?Use of a cane, walker or w/c? No  ?Grab bars in the bathroom? No  ?Shower chair or bench in shower? No  ?Elevated toilet seat or a handicapped toilet? No  ? ?TIMED UP AND GO: ? ?Was the test performed? No .  ? ?Cognitive Function: ?  ?  ? ?  10/19/2021  ? 10:47 AM  ?6CIT Screen  ?What Year? 0 points  ?What month? 0 points  ?What time? 0 points  ?Count back from 20 0 points  ?Months in reverse 0 points  ?Repeat phrase 0 points  ?Total Score 0 points  ? ? ?Immunizations ?Immunization History  ?Administered Date(s) Administered  ? Influenza-Unspecified 03/16/2018, 03/24/2020, 04/12/2021  ? PFIZER(Purple Top)SARS-COV-2 Vaccination 07/13/2019, 08/03/2019, 03/28/2020  ? PNEUMOCOCCAL CONJUGATE-20 09/11/2021  ? Pension scheme manager 30yr & up 09/28/2020  ? Tdap 08/26/2018  ? Zoster Recombinat (Shingrix) 01/28/2019   ? ? ?TDAP status: Up to date ? ?Flu Vaccine status: Up to date ? ?Pneumococcal vaccine status: Up to date ? ?Covid-19 vaccine status: Completed vaccines ? ?Qualifies for Shingles Vaccine? Yes   ?Zostavax comple

## 2021-10-24 DIAGNOSIS — L281 Prurigo nodularis: Secondary | ICD-10-CM | POA: Diagnosis not present

## 2021-10-24 DIAGNOSIS — D2272 Melanocytic nevi of left lower limb, including hip: Secondary | ICD-10-CM | POA: Diagnosis not present

## 2021-10-24 DIAGNOSIS — L814 Other melanin hyperpigmentation: Secondary | ICD-10-CM | POA: Diagnosis not present

## 2021-10-24 DIAGNOSIS — L821 Other seborrheic keratosis: Secondary | ICD-10-CM | POA: Diagnosis not present

## 2021-10-24 DIAGNOSIS — L570 Actinic keratosis: Secondary | ICD-10-CM | POA: Diagnosis not present

## 2021-10-24 DIAGNOSIS — D1801 Hemangioma of skin and subcutaneous tissue: Secondary | ICD-10-CM | POA: Diagnosis not present

## 2022-02-05 ENCOUNTER — Encounter: Payer: Self-pay | Admitting: Family Medicine

## 2022-02-06 NOTE — Telephone Encounter (Signed)
No specific recommendations for any prescription medications. He can check with the health department to make sure that he is up to date on al the recommended travel vaccines.  Jacob Hood. Jerline Pain, MD 02/06/2022 10:44 AM

## 2022-02-26 ENCOUNTER — Ambulatory Visit (INDEPENDENT_AMBULATORY_CARE_PROVIDER_SITE_OTHER): Payer: Medicare Other | Admitting: Physician Assistant

## 2022-02-26 ENCOUNTER — Encounter: Payer: Self-pay | Admitting: Physician Assistant

## 2022-02-26 VITALS — BP 140/78 | HR 64 | Temp 97.9°F | Ht 68.0 in | Wt 158.2 lb

## 2022-02-26 DIAGNOSIS — J069 Acute upper respiratory infection, unspecified: Secondary | ICD-10-CM

## 2022-02-26 MED ORDER — DOXYCYCLINE HYCLATE 100 MG PO TABS: 100.0000 mg | ORAL_TABLET | Freq: Two times a day (BID) | ORAL | 0 refills | Status: DC

## 2022-02-26 NOTE — Progress Notes (Signed)
Jacob Hood is a 71 y.o. male here for an acute problem  History of Present Illness:   Chief Complaint  Patient presents with   Headache    Pt states symptoms started lastnight.     HPI  Hoarse voice Yesterday had sudden onset hoarse voice. Went home, went to bed. Had a headache and just didn't feel well. This morning had a slight cough develop and ongoing hoarse voice. This has improved slightly since yesterday. Has headache b/l on head -- feels like a sinus HA. Wife woke up with similar symptoms and saw Dr. Jonni Sanger today and was diagnosed with laryngitis.   Has done a couple COVID tests and they are negative.  Has not taken anything for this except tylenol or pain.  Has upcoming trips including a international trip to [place] next week.  Denies: Fever, chills, neck stiffness, worst headache of life, vision changes   Past Medical History:  Diagnosis Date   Hyperlipidemia      Social History   Tobacco Use   Smoking status: Former   Smokeless tobacco: Never  Scientific laboratory technician Use: Never used  Substance Use Topics   Alcohol use: Yes    Comment: Socially   Drug use: Never    Past Surgical History:  Procedure Laterality Date   TONSILLECTOMY      Family History  Problem Relation Age of Onset   Dementia Mother    Heart attack Father    Cancer Neg Hx     Allergies  Allergen Reactions   Penicillins     Current Medications:   Current Outpatient Medications:    calcium-vitamin D (OSCAL WITH D) 500-200 MG-UNIT tablet, Take 1 tablet by mouth., Disp: , Rfl:    Cholecalciferol (VITAMIN D3) 125 MCG (5000 UT) CAPS, , Disp: , Rfl:    co-enzyme Q-10 30 MG capsule, Take 30 mg by mouth 3 (three) times daily., Disp: , Rfl:    Omega-3 Fatty Acids (FISH OIL) 1360 MG CAPS, Take by mouth., Disp: , Rfl:    simvastatin (ZOCOR) 20 MG tablet, Take 1 tablet (20 mg total) by mouth daily., Disp: 90 tablet, Rfl: 3   Review of Systems:   ROS Negative unless otherwise specified  per HPI.  Vitals:   Vitals:   02/26/22 1536  BP: (!) 140/78  Pulse: 64  Temp: 97.9 F (36.6 C)  SpO2: 97%  Weight: 158 lb 3.2 oz (71.8 kg)  Height: '5\' 8"'$  (1.727 m)     Body mass index is 24.05 kg/m.  Physical Exam:   Physical Exam Vitals and nursing note reviewed.  Constitutional:      General: He is not in acute distress.    Appearance: He is well-developed. He is not ill-appearing or toxic-appearing.  HENT:     Head: Normocephalic and atraumatic.     Right Ear: Tympanic membrane, ear canal and external ear normal. Tympanic membrane is not erythematous, retracted or bulging.     Left Ear: Tympanic membrane, ear canal and external ear normal. Tympanic membrane is not erythematous, retracted or bulging.     Nose: Nose normal.     Right Sinus: No maxillary sinus tenderness or frontal sinus tenderness.     Left Sinus: No maxillary sinus tenderness or frontal sinus tenderness.     Mouth/Throat:     Pharynx: Uvula midline. Posterior oropharyngeal erythema present.  Eyes:     General: Lids are normal.     Conjunctiva/sclera: Conjunctivae normal.  Neck:  Trachea: Trachea normal.  Cardiovascular:     Rate and Rhythm: Normal rate and regular rhythm.     Pulses: Normal pulses.     Heart sounds: Normal heart sounds, S1 normal and S2 normal.  Pulmonary:     Effort: Pulmonary effort is normal.     Breath sounds: Normal breath sounds. No decreased breath sounds, wheezing, rhonchi or rales.  Lymphadenopathy:     Cervical: No cervical adenopathy.  Skin:    General: Skin is warm and dry.  Neurological:     Mental Status: He is alert.     GCS: GCS eye subscore is 4. GCS verbal subscore is 5. GCS motor subscore is 6.  Psychiatric:        Speech: Speech normal.        Behavior: Behavior normal. Behavior is cooperative.     Assessment and Plan:   Viral URI No red flags on discussion, patient is not in any obvious distress during our visit. Discussed progression of most  viral illness, and recommended supportive care at this point in time. I did however provide pocket rx for oral doxycycline should symptoms not improve as anticipated prior to or during his upcoming trip to [place]. Discussed over the counter supportive care options, with recommendations to push fluids and rest. Reviewed return precautions including new/worsening fever, SOB, new/worsening cough or other concerns.  Recommended need to self-quarantine and practice social distancing until symptoms resolve. Discussed current recommendations for COVID testing. I recommend that patient follow-up if symptoms worsen or persist despite treatment x 7-10 days, sooner if needed.  Inda Coke, PA-C

## 2022-03-02 IMAGING — MR MR FOOT*L* W/O CM
5 series · 40 of 40 positions shown · non-contrast
Comparison: Foot radiograph 02/07/2021

CLINICAL DATA: Foot pain, chronic, osteoarthritis suspected Left
arch foot pain after playing pickleball 2 months ago. No sx or
injections. No numbness or tingling.

EXAM:
MRI OF THE LEFT FOOT WITHOUT CONTRAST
TECHNIQUE: Multiplanar, multisequence MR imaging of the left forefoot was
performed. No intravenous contrast was administered.

[Series 3: T1 · coronal · 3.0mm · 0.53mm/px · 10 of 50 slices shown (1 of 2)]
[im 1/50]
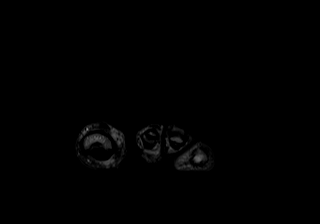
[im 6/50]
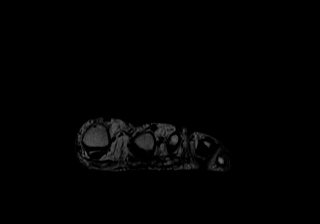
[im 11/50]
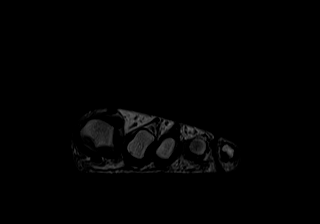
[im 17/50]
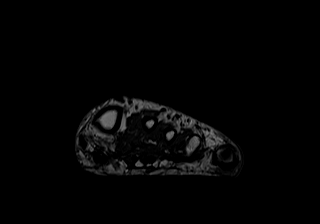
[im 22/50]
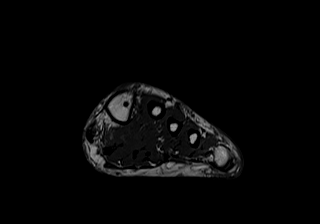
[im 28/50]
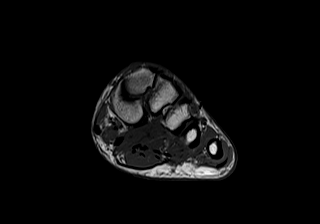
[im 33/50]
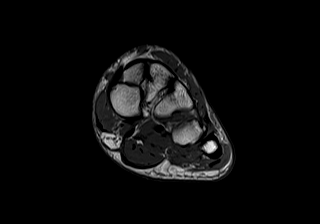
[im 39/50]
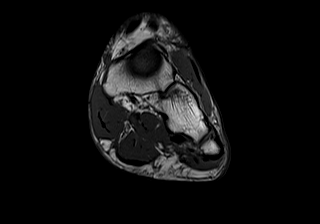
[im 44/50]
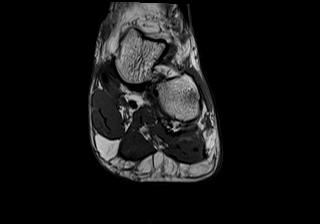
[im 50/50]
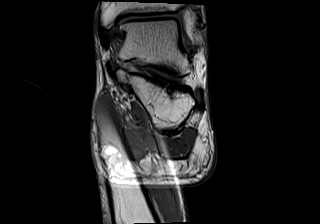

[Series 4: T2 fat-sat · coronal · 3.0mm · 0.53mm/px · 11 of 50 slices shown (1 of 2)]
[im 1/50]
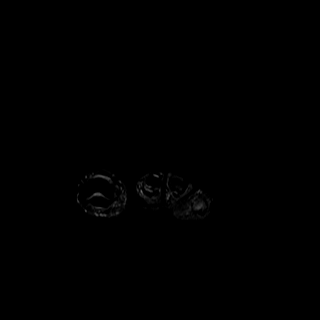
[im 5/50]
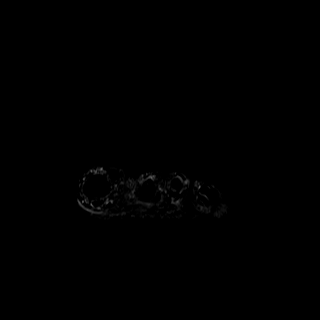
[im 10/50]
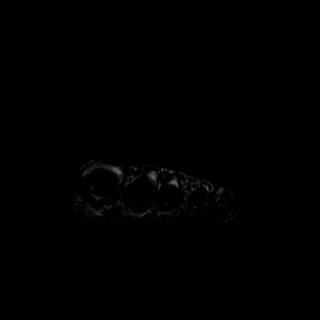
[im 15/50]
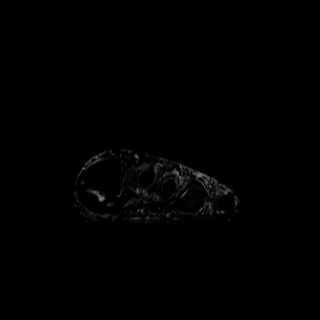
[im 20/50]
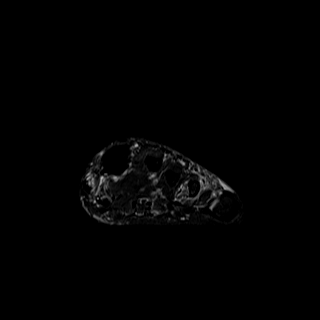
[im 25/50]
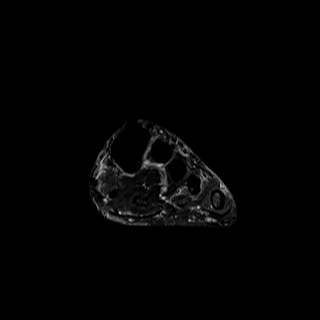
[im 30/50]
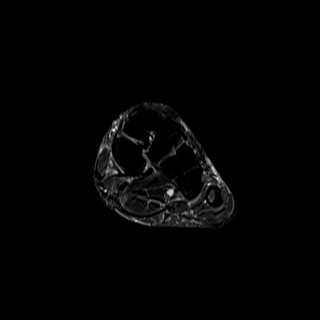
[im 35/50]
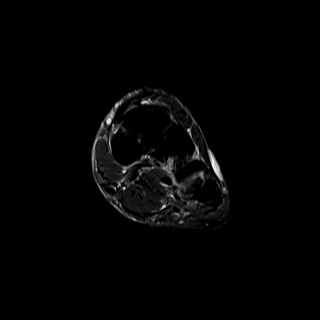
[im 40/50]
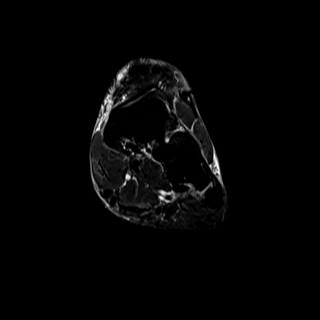
[im 45/50]
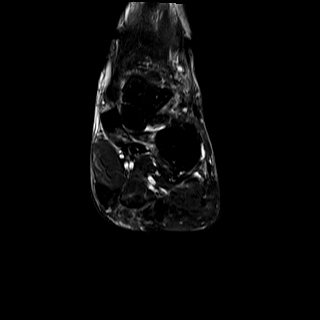
[im 50/50]
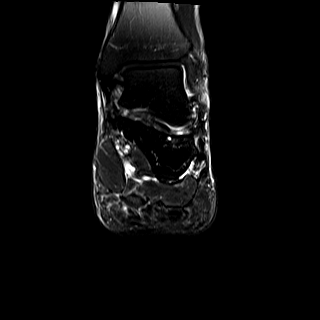

[Series 5: T1 · axial · 3.0mm · 0.59mm/px · z∈[-52,+39]mm · 6 of 29 slices shown (2 of 2)]
[im 1/29]
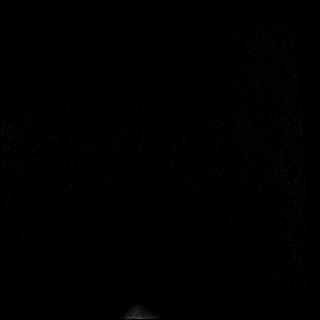
[im 6/29]
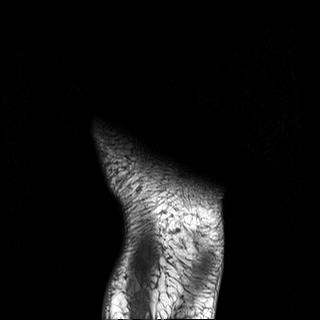
[im 12/29]
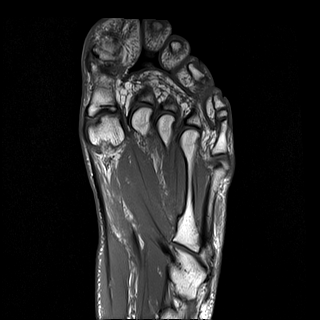
[im 17/29]
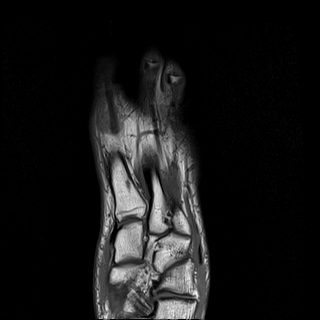
[im 23/29]
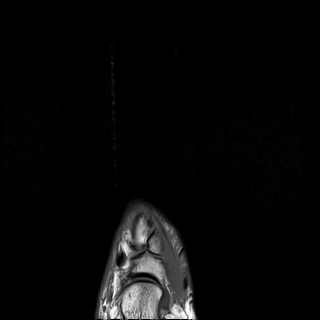
[im 29/29]
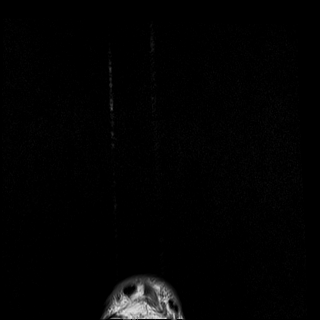

[Series 6: T2 fat-sat · axial · 3.0mm · 0.74mm/px · z∈[-52,+39]mm · 6 of 29 slices shown (2 of 2)]
[im 1/29]
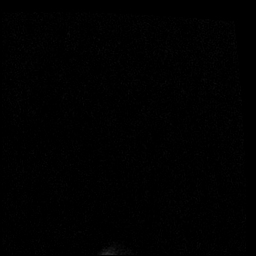
[im 6/29]
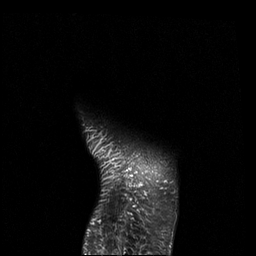
[im 12/29]
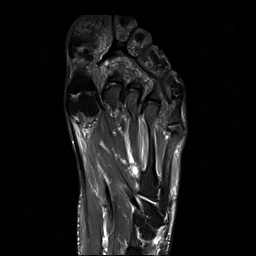
[im 17/29]
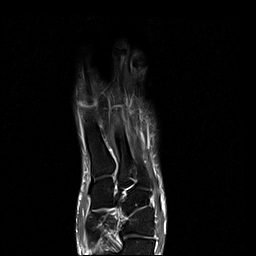
[im 23/29]
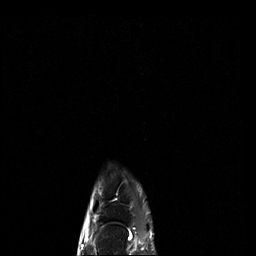
[im 29/29]
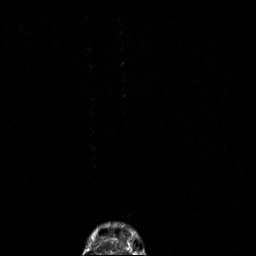

[Series 7: STIR · sagittal · 3.0mm · 0.74mm/px · 7 of 31 slices shown]
[im 1/31]
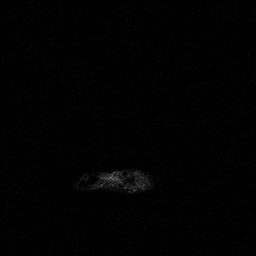
[im 6/31]
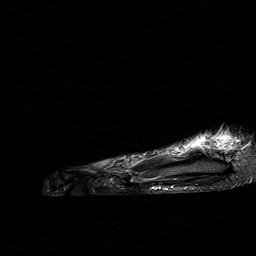
[im 11/31]
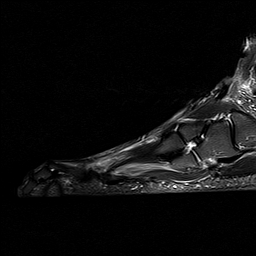
[im 16/31]
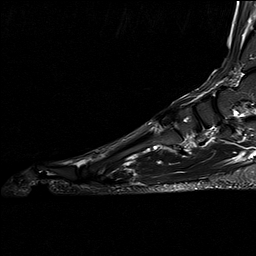
[im 21/31]
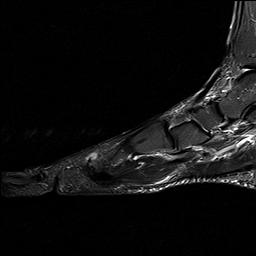
[im 26/31]
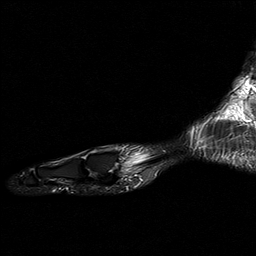
[im 31/31]
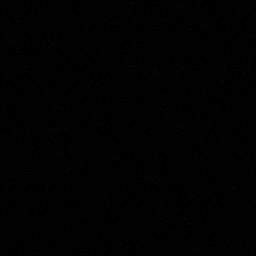

[40 of 40 positions shown; findings below may reference images not displayed]

FINDINGS: Bones/Joint/Cartilage

The cortex is intact. There is mild first MTP osteoarthritis. There
is a bipartite medial cuneiform, with mild focal edema and cortical
irregularity suggesting a partial fusion and or degenerative change
(axial T2 image 20, coronal T2 image 12).

Minimal bony edema signal within the fifth metatarsal shaft without
visible fracture line.

Mild edema signal within the lateral great toe sesamoid at the MTP
joint.

Ligaments

The Lisfranc ligament appears intact. No evidence of plantar plate
tear.

Muscles and Tendons

No significant muscle atrophy or edema.

Soft tissues

No evidence of intermetatarsal neuroma or bursitis. No focal fluid
collection. There is mild diffuse soft tissue swelling of the foot.
IMPRESSION: Bipartite medial cuneiform, with mild focal edema and cortical
irregularity suggesting a partial fusion and or degenerative change.
This could potentially be a source of medial arch pain, although
this is most often considered an incidental and asymptomatic
finding.

Minimal bony edema signal within the fifth metatarsal shaft, without
visible fracture line, favored to be a stress reaction.

Mild edema signal within the lateral great toe sesamoid at the MTP
joint, consistent with mild sesamoiditis or metatarso-sesamoid
degenerative arthritis. Mild first MTP osteoarthritis.

No acute tendon tear or ligamentous injury the midfoot or forefoot.

## 2022-03-12 DIAGNOSIS — Z23 Encounter for immunization: Secondary | ICD-10-CM | POA: Diagnosis not present

## 2022-03-16 ENCOUNTER — Encounter: Payer: Self-pay | Admitting: Family Medicine

## 2022-03-20 ENCOUNTER — Encounter: Payer: Self-pay | Admitting: Physician Assistant

## 2022-03-20 ENCOUNTER — Ambulatory Visit (INDEPENDENT_AMBULATORY_CARE_PROVIDER_SITE_OTHER): Payer: Medicare Other | Admitting: Physician Assistant

## 2022-03-20 VITALS — BP 110/60 | HR 59 | Temp 97.7°F | Ht 68.0 in | Wt 160.0 lb

## 2022-03-20 DIAGNOSIS — R0981 Nasal congestion: Secondary | ICD-10-CM

## 2022-03-20 MED ORDER — IPRATROPIUM BROMIDE 0.06 % NA SOLN: 2.0000 | Freq: Two times a day (BID) | NASAL | 12 refills | Status: DC

## 2022-03-20 NOTE — Progress Notes (Signed)
Jacob Hood is a 71 y.o. male here for a follow up of a pre-existing problem.  History of Present Illness:   Chief Complaint  Patient presents with   Cough    Pt still cough and pressure in his head. Cough is non-productive. Has some pressure behind his ears. Has done some sinus washes but keeps coming back. He was treated with Doxy on 9/5.    Cough    Sinus congestion Saw me on 02/26/22. Completed the doxycycline and took as directed. Did not make a difference in his symptoms. Has ongoing sinus pressure. Mostly frontal. Doing neti-pot at home which helps just mildly. Will take ibuprofen occasionally. Has had negative covid tests.  Denies: fevers, chills, neck stiffness, n/v/d, productive cough   Past Medical History:  Diagnosis Date   Hyperlipidemia      Social History   Tobacco Use   Smoking status: Former   Smokeless tobacco: Never  Scientific laboratory technician Use: Never used  Substance Use Topics   Alcohol use: Yes    Comment: Socially   Drug use: Never    Past Surgical History:  Procedure Laterality Date   TONSILLECTOMY      Family History  Problem Relation Age of Onset   Dementia Mother    Heart attack Father    Cancer Neg Hx     Allergies  Allergen Reactions   Penicillins     Current Medications:   Current Outpatient Medications:    calcium-vitamin D (OSCAL WITH D) 500-200 MG-UNIT tablet, Take 1 tablet by mouth., Disp: , Rfl:    Cholecalciferol (VITAMIN D3) 125 MCG (5000 UT) CAPS, , Disp: , Rfl:    co-enzyme Q-10 30 MG capsule, Take 30 mg by mouth 3 (three) times daily., Disp: , Rfl:    Omega-3 Fatty Acids (FISH OIL) 1360 MG CAPS, Take by mouth., Disp: , Rfl:    simvastatin (ZOCOR) 20 MG tablet, Take 1 tablet (20 mg total) by mouth daily., Disp: 90 tablet, Rfl: 3   Review of Systems:   Review of Systems  Respiratory:  Positive for cough.    Negative unless otherwise specified per HPI.  Vitals:   Vitals:   03/20/22 0901  BP: 110/60  Pulse:  (!) 59  Temp: 97.7 F (36.5 C)  TempSrc: Temporal  SpO2: 96%  Weight: 160 lb (72.6 kg)  Height: '5\' 8"'$  (1.727 m)     Body mass index is 24.33 kg/m.  Physical Exam:   Physical Exam Vitals and nursing note reviewed.  Constitutional:      General: He is not in acute distress.    Appearance: He is well-developed. He is not ill-appearing or toxic-appearing.  HENT:     Head: Normocephalic and atraumatic.     Right Ear: Ear canal and external ear normal. There is impacted cerumen. Tympanic membrane is not erythematous, retracted or bulging.     Left Ear: Tympanic membrane, ear canal and external ear normal. There is impacted cerumen. Tympanic membrane is not erythematous, retracted or bulging.     Nose: Nose normal.     Right Sinus: No maxillary sinus tenderness or frontal sinus tenderness.     Left Sinus: No maxillary sinus tenderness or frontal sinus tenderness.     Mouth/Throat:     Pharynx: Uvula midline. No posterior oropharyngeal erythema.  Eyes:     General: Lids are normal.     Conjunctiva/sclera: Conjunctivae normal.  Neck:     Trachea: Trachea normal.  Cardiovascular:  Rate and Rhythm: Normal rate and regular rhythm.     Pulses: Normal pulses.     Heart sounds: Normal heart sounds, S1 normal and S2 normal.  Pulmonary:     Effort: Pulmonary effort is normal.     Breath sounds: Normal breath sounds. No decreased breath sounds, wheezing, rhonchi or rales.  Lymphadenopathy:     Cervical: No cervical adenopathy.  Skin:    General: Skin is warm and dry.  Neurological:     Mental Status: He is alert.     GCS: GCS eye subscore is 4. GCS verbal subscore is 5. GCS motor subscore is 6.  Psychiatric:        Speech: Speech normal.        Behavior: Behavior normal. Behavior is cooperative.    Ceruminosis is noted.  Wax is removed by syringing and manual debridement.   Assessment and Plan:   Sinus congestion No red flags Will trial saline nasal spray followed by  atrovent nasal spray  If new/worsening sx, asked him to reach out and may add on either prednisone and/or alternative abx   Inda Coke, PA-C

## 2022-03-20 NOTE — Patient Instructions (Signed)
It was great to see you!  Start saline nasal spray in AM and PM Follow this with atrovent nasal spray  Message if no improvement We will then add steroids and/or a different antibiotic  Keep me posted!  Take care,  Inda Coke PA-C

## 2022-03-28 DIAGNOSIS — Z23 Encounter for immunization: Secondary | ICD-10-CM | POA: Diagnosis not present

## 2022-04-01 ENCOUNTER — Other Ambulatory Visit: Payer: Self-pay | Admitting: *Deleted

## 2022-06-06 ENCOUNTER — Telehealth: Payer: Self-pay | Admitting: *Deleted

## 2022-06-06 NOTE — Telephone Encounter (Signed)
Patient need OV for TB test and form completion, Assisting Living  form

## 2022-06-11 ENCOUNTER — Encounter: Payer: Self-pay | Admitting: Family Medicine

## 2022-06-11 ENCOUNTER — Ambulatory Visit (INDEPENDENT_AMBULATORY_CARE_PROVIDER_SITE_OTHER): Payer: Medicare Other | Admitting: Family Medicine

## 2022-06-11 VITALS — BP 145/72 | HR 60 | Temp 97.7°F | Resp 96 | Ht 68.0 in | Wt 160.6 lb

## 2022-06-11 DIAGNOSIS — Z111 Encounter for screening for respiratory tuberculosis: Secondary | ICD-10-CM

## 2022-06-11 DIAGNOSIS — R030 Elevated blood-pressure reading, without diagnosis of hypertension: Secondary | ICD-10-CM

## 2022-06-11 DIAGNOSIS — E785 Hyperlipidemia, unspecified: Secondary | ICD-10-CM | POA: Diagnosis not present

## 2022-06-11 NOTE — Patient Instructions (Signed)
It was very nice to see you today!  Your exam today is NORMAL.  We will check for TB with blood test.  Keep an eye on your blood pressure and let me know if it is persistently elevated.  We will fax over your form once we get your blood work back.  Take care, Dr Jerline Pain  PLEASE NOTE:  If you had any lab tests, please let us know if you have not heard back within a few days. You may see your results on mychart before we have a chance to review them but we will give you a call once they are reviewed by [place].   If we ordered any referrals today, please let us know if you have not heard from their office within the next week.   If you had any urgent prescriptions sent in today, please check with the pharmacy within an hour of our visit to make sure the prescription was transmitted appropriately.   Please try these tips to maintain a healthy lifestyle:  Eat at least 3 REAL meals and 1-2 snacks per day.  Aim for no more than 5 hours between eating.  If you eat breakfast, please do so within one hour of getting up.   Each meal should contain half fruits/vegetables, one quarter protein, and one quarter carbs (no bigger than a computer mouse)  Cut down on sweet beverages. This includes juice, soda, and sweet tea.   Drink at least 1 glass of water with each meal and aim for at least 8 glasses per day  Exercise at least 150 minutes every week.

## 2022-06-11 NOTE — Assessment & Plan Note (Signed)
Last lipids at goal on simvastatin 20 mg daily.

## 2022-06-11 NOTE — Progress Notes (Signed)
   Jacob Hood is a 71 y.o. male who presents today for an office visit.  Assessment/Plan:  New/Acute Problems: Encounter for form completion Patient's form for admission to independent living was completed today.  As part of this form completion we performed a complete physical exam which was normal.  We also performed cognitive assessment with MoCA which was also normal with a score of 30 out of 30.  We will check QuantiFERON gold to screen for TB.  Will fax records over to facility once his labs come back.  Chronic Problems Addressed Today: Dyslipidemia Last lipids at goal on simvastatin 20 mg daily.  Elevated blood pressure reading Blood pressure at goal per JNC 8.  Continue home monitoring.     Subjective:  HPI:  See A/P for status of chronic conditions.  Patient is here today for form completion.  He will be moving into independent living facility and may require a comprehensive physical and neurologic exam prior to him moving in.  Does not have any acute concerns today.  He has been compliant with medications.       Objective:  Physical Exam: BP (!) 145/72   Pulse 60   Temp 97.7 F (36.5 C) (Temporal)   Resp (!) 96   Ht '5\' 8"'$  (1.727 m)   Wt 160 lb 9.6 oz (72.8 kg)   BMI 24.42 kg/m   Gen: No acute distress, resting comfortably CV: Regular rate and rhythm with no murmurs appreciated Pulm: Normal work of breathing, clear to auscultation bilaterally with no crackles, wheezes, or rhonchi Neuro: Cranial nerves II through XII intact.  Strength 5 out of 5 in upper and lower extremities.  Sensation to light touch intact throughout.  Reflexes 2+ and symmetric bilaterally.  MoCA 30 out of 30. Psych: Normal affect and thought content  Time Spent: 40 minutes of total time was spent on the date of the encounter performing the following actions: chart review prior to seeing the patient, obtaining history, performing a medically necessary exam, conducting his MoCA testing and  discussing results, counseling on the treatment plan, placing orders, and documenting in our EHR.        Algis Greenhouse. Jerline Pain, MD 06/11/2022 11:08 AM

## 2022-06-11 NOTE — Assessment & Plan Note (Signed)
Blood pressure at goal per JNC 8.  Continue home monitoring.

## 2022-06-13 LAB — QUANTIFERON-TB GOLD PLUS
Mitogen-NIL: >10 IU/mL
NIL: 0.05 IU/mL
QuantiFERON-TB Gold Plus: NEGATIVE
TB1-NIL: 0.01 IU/mL
TB2-NIL: 0.01 IU/mL

## 2022-06-13 NOTE — Progress Notes (Signed)
Please inform patient of the following:  Great news! TB test is negative. It is ok for [place] to fax over his forms for his independent living facility.  Algis Greenhouse. Jerline Pain, MD 06/13/2022 7:50 AM

## 2022-08-16 DIAGNOSIS — H5213 Myopia, bilateral: Secondary | ICD-10-CM | POA: Diagnosis not present

## 2022-08-16 DIAGNOSIS — H04123 Dry eye syndrome of bilateral lacrimal glands: Secondary | ICD-10-CM | POA: Diagnosis not present

## 2022-08-16 DIAGNOSIS — H2513 Age-related nuclear cataract, bilateral: Secondary | ICD-10-CM | POA: Diagnosis not present

## 2022-08-26 ENCOUNTER — Other Ambulatory Visit: Payer: Self-pay | Admitting: Family Medicine

## 2022-09-13 ENCOUNTER — Encounter: Payer: Medicare Other | Admitting: Family Medicine

## 2022-10-07 ENCOUNTER — Encounter: Payer: Self-pay | Admitting: *Deleted

## 2022-10-09 ENCOUNTER — Ambulatory Visit (INDEPENDENT_AMBULATORY_CARE_PROVIDER_SITE_OTHER): Payer: Medicare Other | Admitting: Family Medicine

## 2022-10-09 ENCOUNTER — Encounter: Payer: Self-pay | Admitting: Family Medicine

## 2022-10-09 VITALS — BP 130/82 | HR 57 | Temp 98.1°F | Resp 16 | Ht 68.0 in | Wt 158.8 lb

## 2022-10-09 DIAGNOSIS — R030 Elevated blood-pressure reading, without diagnosis of hypertension: Secondary | ICD-10-CM | POA: Diagnosis not present

## 2022-10-09 DIAGNOSIS — E785 Hyperlipidemia, unspecified: Secondary | ICD-10-CM | POA: Diagnosis not present

## 2022-10-09 DIAGNOSIS — R351 Nocturia: Secondary | ICD-10-CM | POA: Diagnosis not present

## 2022-10-09 DIAGNOSIS — R739 Hyperglycemia, unspecified: Secondary | ICD-10-CM | POA: Diagnosis not present

## 2022-10-09 LAB — LIPID PANEL
Cholesterol: 196 mg/dL (ref 0–200)
HDL: 42.5 mg/dL (ref >39.00)
NonHDL: 153.48
Total CHOL/HDL Ratio: 5
Triglycerides: 222 mg/dL — ABNORMAL HIGH (ref 0.0–149.0)
VLDL: 44.4 mg/dL — ABNORMAL HIGH (ref 0.0–40.0)

## 2022-10-09 LAB — COMPREHENSIVE METABOLIC PANEL
ALT: 26 U/L (ref 0–53)
AST: 24 U/L (ref 0–37)
Albumin: 4.5 g/dL (ref 3.5–5.2)
Alkaline Phosphatase: 28 U/L — ABNORMAL LOW (ref 39–117)
BUN: 17 mg/dL (ref 6–23)
CO2: 29 mEq/L (ref 19–32)
Calcium: 9.4 mg/dL (ref 8.4–10.5)
Chloride: 101 mEq/L (ref 96–112)
Creatinine, Ser: 1.09 mg/dL (ref 0.40–1.50)
GFR: 67.17 mL/min (ref >60.00)
Glucose, Bld: 94 mg/dL (ref 70–99)
Potassium: 4.1 mEq/L (ref 3.5–5.1)
Sodium: 139 mEq/L (ref 135–145)
Total Bilirubin: 0.7 mg/dL (ref 0.2–1.2)
Total Protein: 6.7 g/dL (ref 6.0–8.3)

## 2022-10-09 LAB — CBC
HCT: 42.7 % (ref 39.0–52.0)
Hemoglobin: 14.1 g/dL (ref 13.0–17.0)
MCHC: 33.1 g/dL (ref 30.0–36.0)
MCV: 92.6 fl (ref 78.0–100.0)
Platelets: 181 10*3/uL (ref 150.0–400.0)
RBC: 4.61 Mil/uL (ref 4.22–5.81)
RDW: 14.3 % (ref 11.5–15.5)
WBC: 4.7 10*3/uL (ref 4.0–10.5)

## 2022-10-09 LAB — LDL CHOLESTEROL, DIRECT: Direct LDL: 98 mg/dL

## 2022-10-09 LAB — PSA: PSA: 0.98 ng/mL (ref 0.10–4.00)

## 2022-10-09 LAB — TSH: TSH: 2.14 u[IU]/mL (ref 0.35–5.50)

## 2022-10-09 LAB — HEMOGLOBIN A1C: Hgb A1c MFr Bld: 6 % (ref 4.6–6.5)

## 2022-10-09 NOTE — Assessment & Plan Note (Signed)
Check A1c.  Discussed lifestyle modifications. °

## 2022-10-09 NOTE — Assessment & Plan Note (Signed)
Doing well on simvastatin 20 mg daily.  No side effects.  Check lipids today.

## 2022-10-09 NOTE — Patient Instructions (Signed)
It was very nice to see you today!  We will check blood work today.  Please continue to work on diet and exercise.  Return in about 1 year (around 10/09/2023). For your annual physical.   Take care, Dr Jimmey Ralph  PLEASE NOTE:  If you had any lab tests, please let us know if you have not heard back within a few days. You may see your results on mychart before we have a chance to review them but we will give you a call once they are reviewed by [place].   If we ordered any referrals today, please let us know if you have not heard from their office within the next week.   If you had any urgent prescriptions sent in today, please check with the pharmacy within an hour of our visit to make sure the prescription was transmitted appropriately.   Please try these tips to maintain a healthy lifestyle:  Eat at least 3 REAL meals and 1-2 snacks per day.  Aim for no more than 5 hours between eating.  If you eat breakfast, please do so within one hour of getting up.   Each meal should contain half fruits/vegetables, one quarter protein, and one quarter carbs (no bigger than a computer mouse)  Cut down on sweet beverages. This includes juice, soda, and sweet tea.   Drink at least 1 glass of water with each meal and aim for at least 8 glasses per day  Exercise at least 150 minutes every week.    Preventive Care 53 Years and Older, Male Preventive care refers to lifestyle choices and visits with your health care provider that can promote health and wellness. Preventive care visits are also called wellness exams. What can I expect for my preventive care visit? Counseling During your preventive care visit, your health care provider may ask about your: Medical history, including: Past medical problems. Family medical history. History of falls. Current health, including: Emotional well-being. Home life and relationship well-being. Sexual activity. Memory and ability to understand  (cognition). Lifestyle, including: Alcohol, nicotine or tobacco, and drug use. Access to firearms. Diet, exercise, and sleep habits. Work and work Astronomer. Sunscreen use. Safety issues such as seatbelt and bike helmet use. Physical exam Your health care provider will check your: Height and weight. These may be used to calculate your BMI (body mass index). BMI is a measurement that tells if you are at a healthy weight. Waist circumference. This measures the distance around your waistline. This measurement also tells if you are at a healthy weight and may help predict your risk of certain diseases, such as type 2 diabetes and high blood pressure. Heart rate and blood pressure. Body temperature. Skin for abnormal spots. What immunizations do I need?  Vaccines are usually given at various ages, according to a schedule. Your health care provider will recommend vaccines for you based on your age, medical history, and lifestyle or other factors, such as travel or where you work. What tests do I need? Screening Your health care provider may recommend screening tests for certain conditions. This may include: Lipid and cholesterol levels. Diabetes screening. This is done by checking your blood sugar (glucose) after you have not eaten for a while (fasting). Hepatitis C test. Hepatitis B test. HIV (human immunodeficiency virus) test. STI (sexually transmitted infection) testing, if you are at risk. Lung cancer screening. Colorectal cancer screening. Prostate cancer screening. Abdominal aortic aneurysm (AAA) screening. You may need this if you are a current or former  smoker. Talk with your health care provider about your test results, treatment options, and if necessary, the need for more tests. Follow these instructions at home: Eating and drinking  Eat a diet that includes fresh fruits and vegetables, whole grains, lean protein, and low-fat dairy products. Limit your intake of foods with  high amounts of sugar, saturated fats, and salt. Take vitamin and mineral supplements as recommended by your health care provider. Do not drink alcohol if your health care provider tells you not to drink. If you drink alcohol: Limit how much you have to 0-2 drinks a day. Know how much alcohol is in your drink. In the [place] one drink equals one 12 oz bottle of beer (355 mL), one 5 oz glass of wine (148 mL), or one 1 oz glass of hard liquor (44 mL). Lifestyle Brush your teeth every morning and night with fluoride toothpaste. Floss one time each day. Exercise for at least 30 minutes 5 or more days each week. Do not use any products that contain nicotine or tobacco. These products include cigarettes, chewing tobacco, and vaping devices, such as e-cigarettes. If you need help quitting, ask your health care provider. Do not use drugs. If you are sexually active, practice safe sex. Use a condom or other form of protection to prevent STIs. Take aspirin only as told by your health care provider. Make sure that you understand how much to take and what form to take. Work with your health care provider to find out whether it is safe and beneficial for you to take aspirin daily. Ask your health care provider if you need to take a cholesterol-lowering medicine (statin). Find healthy ways to manage stress, such as: Meditation, yoga, or listening to music. Journaling. Talking to a trusted person. Spending time with friends and family. Safety Always wear your seat belt while driving or riding in a vehicle. Do not drive: If you have been drinking alcohol. Do not ride with someone who has been drinking. When you are tired or distracted. While texting. If you have been using any mind-altering substances or drugs. Wear a helmet and other protective equipment during sports activities. If you have firearms in your house, make sure you follow all gun safety procedures. Minimize exposure to UV radiation to  reduce your risk of skin cancer. What's next? Visit your health care provider once a year for an annual wellness visit. Ask your health care provider how often you should have your eyes and teeth checked. Stay up to date on all vaccines. This information is not intended to replace advice given to you by your health care provider. Make sure you discuss any questions you have with your health care provider. Document Revised: 12/06/2020 Document Reviewed: 12/06/2020 Elsevier Patient Education  2023 [employer].

## 2022-10-09 NOTE — Assessment & Plan Note (Signed)
At goal today without meds. 

## 2022-10-09 NOTE — Progress Notes (Signed)
   Jacob Hood is a 72 y.o. male who presents today for an office visit.  Assessment/Plan:  Chronic Problems Addressed Today: No problem-specific Assessment & Plan notes found for this encounter.  Preventative health care Check labs.  Due for colonoscopy in 2 years.  Up-to-date on vaccines.    Subjective:  HPI:  See A/p for status of chronic conditions. He is here today for chronic disease follow up and annual exam.    He is doing a good job with staying active. Works with yoga. Going to gym routinely. He is eating a healthy diet.  Try to get plenty of fruits and veggies.       Objective:  Physical Exam: BP 130/82   Pulse (!) 57   Temp 98.1 F (36.7 C) (Temporal)   Resp 16   Ht  (1.727 m)   Wt 158 lb 12.8 oz (72 kg)   SpO2 97%   BMI 24.15 kg/m   Gen: No acute distress, resting comfortably CV: Regular rate and rhythm with no murmurs appreciated Pulm: Normal work of breathing, clear to auscultation bilaterally with no crackles, wheezes, or rhonchi Neuro: Grossly normal, moves all extremities Psych: Normal affect and thought content      Jacob Hood M. Jimmey Ralph, MD 10/09/2022 7:54 AM

## 2022-10-11 NOTE — Progress Notes (Signed)
Good news!  Labs are all stable.  Cholesterol is up a bit but still at goal.  Blood sugar is borderline but stable.  Everything else is normal.  Do not need to make any changes to treatment plan.  He should continue to work on diet and exercise and we can recheck in a year.

## 2022-10-16 ENCOUNTER — Telehealth: Payer: Self-pay | Admitting: Family Medicine

## 2022-10-16 NOTE — Telephone Encounter (Signed)
Contacted Jacob Hood to schedule their annual wellness visit. Appointment made for 10/24/2022.  Gabriel Cirri [facility] AWV TEAM Direct Dial [phone]

## 2022-10-24 ENCOUNTER — Ambulatory Visit (INDEPENDENT_AMBULATORY_CARE_PROVIDER_SITE_OTHER): Payer: Medicare Other

## 2022-10-24 VITALS — Wt 158.0 lb

## 2022-10-24 DIAGNOSIS — Z0000 Encounter for general adult medical examination without abnormal findings: Secondary | ICD-10-CM | POA: Diagnosis not present

## 2022-10-24 NOTE — Progress Notes (Signed)
I connected with  Georg Ruddle on 10/24/22 by a audio enabled telemedicine application and verified that I am speaking with the correct person using two identifiers.  Patient Location: Home  Provider Location: Office/Clinic  I discussed the limitations of evaluation and management by telemedicine. The patient expressed understanding and agreed to proceed.   Subjective:   Jacob Hood is a 72 y.o. male who presents for Medicare Annual/Subsequent preventive examination.  Review of Systems     Cardiac Risk Factors include: advanced age (>60men, >7 women);male gender;dyslipidemia     Objective:    Today's Vitals   10/24/22 0905  Weight: 158 lb (71.7 kg)   Body mass index is 24.02 kg/m.     10/24/2022    9:10 AM 10/19/2021   10:45 AM 09/14/2020    2:25 PM  Advanced Directives  Does Patient Have a Medical Advance Directive? Yes Yes Yes  Type of Estate agent of [place];Living will Healthcare Power of [place];Living will Healthcare Power of Attorney  Does patient want to make changes to medical advance directive?   No - Patient declined  Copy of Healthcare Power of Attorney in Chart? No - copy requested No - copy requested No - copy requested    Current Medications (verified) Outpatient Encounter Medications as of 10/24/2022  Medication Sig   Calcium Carbonate-Vitamin D 600-10 MG-MCG TABS Take 1 tablet by mouth.   Carboxymethylcellulose Sodium (REFRESH LIQUIGEL OP) Apply to eye.   Cholecalciferol (VITAMIN D3) 125 MCG (5000 UT) CAPS 150 capsules.   co-enzyme Q-10 30 MG capsule Take 30 mg by mouth 3 (three) times daily.   Krill Oil 1000 MG CAPS 2,000 capsules.   simvastatin (ZOCOR) 20 MG tablet TAKE 1 TABLET DAILY   [DISCONTINUED] clindamycin (CLINDAGEL) 1 % gel Apply topically.   [DISCONTINUED] metronidazole (NORITATE) 1 % cream Apply topically daily.   No facility-administered encounter medications on file as of 10/24/2022.    Allergies  (verified) Penicillins   History: Past Medical History:  Diagnosis Date   Hyperlipidemia    Past Surgical History:  Procedure Laterality Date   TONSILLECTOMY     Family History  Problem Relation Age of Onset   Dementia Mother    Heart attack Father    Cancer Neg Hx    Social History   Socioeconomic History   Marital status: Married    Spouse name: Not on file   Number of children: Not on file   Years of education: Not on file   Highest education level: Not on file  Occupational History   Not on file  Tobacco Use   Smoking status: Former   Smokeless tobacco: Never  Vaping Use   Vaping Use: Never used  Substance and Sexual Activity   Alcohol use: Yes    Comment: Socially   Drug use: Never   Sexual activity: Yes  Other Topics Concern   Not on file  Social History Narrative   Not on file   Social Determinants of Health   Financial Resource Strain: Low Risk  (10/24/2022)   Overall Financial Resource Strain (CARDIA)    Difficulty of Paying Living Expenses: Not hard at all  Food Insecurity: No Food Insecurity (10/24/2022)   Hunger Vital Sign    Worried About Running Out of Food in the Last Year: Never true    Ran Out of Food in the Last Year: Never true  Transportation Needs: No Transportation Needs (10/24/2022)   PRAPARE - Transportation    Lack of Transportation (  Medical): No    Lack of Transportation (Non-Medical): No  Physical Activity: Sufficiently Active (10/24/2022)   Exercise Vital Sign    Days of Exercise per Week: 5 days    Minutes of Exercise per Session: 90 min  Stress: No Stress Concern Present (10/24/2022)   [name] of Occupational Health - Occupational Stress Questionnaire    Feeling of Stress : Not at all  Social Connections: Moderately Integrated (10/24/2022)   Social Connection and Isolation Panel [NHANES]    Frequency of Communication with Friends and Family: More than three times a week    Frequency of Social Gatherings with Friends and  Family: More than three times a week    Attends Religious Services: Never    Database administrator or Organizations: Yes    Attends Engineer, structural: 1 to 4 times per year    Marital Status: Married    Tobacco Counseling Counseling given: Not Answered   Clinical Intake:  Pre-visit preparation completed: Yes  Pain : No/denies pain     BMI - recorded: 24.02 Nutritional Status: BMI of 19-24  Normal Nutritional Risks: None Diabetes: No  How often do you need to have someone help you when you read instructions, pamphlets, or other written materials from your doctor or pharmacy?: 1 - Never  Diabetic?no   Interpreter Needed?: No  Information entered by :: Lanier Ensign, LPN   Activities of Daily Living    10/24/2022    9:11 AM  In your present state of health, do you have any difficulty performing the following activities:  Hearing? 0  Vision? 0  Difficulty concentrating or making decisions? 0  Walking or climbing stairs? 0  Dressing or bathing? 0  Doing errands, shopping? 0  Preparing Food and eating ? N  Using the Toilet? N  In the past six months, have you accidently leaked urine? N  Do you have problems with loss of bowel control? N  Managing your Medications? N  Managing your Finances? N  Housekeeping or managing your Housekeeping? N    Patient Care Team: Ardith Dark, MD as PCP - General (Family Medicine)  Indicate any recent Medical Services you may have received from other than [facility] providers in the past year (date may be approximate).     Assessment:   This is a routine wellness examination for [employer].  Hearing/Vision screen Hearing Screening - Comments:: Pt denies any hearing issues  Vision Screening - Comments:: Pt follows up with Dr Cathey Endow for annual eye exams   Dietary issues and exercise activities discussed: Current Exercise Habits: Home exercise routine, Type of exercise: walking;Other - see comments, Time (Minutes): > 60,  Frequency (Times/Week): 5, Weekly Exercise (Minutes/Week): 0   Goals Addressed             This Visit's Progress    Patient Stated       Stay healthy physically and mentally        Depression Screen    10/24/2022    9:09 AM 10/09/2022    7:30 AM 06/11/2022    9:31 AM 10/19/2021   10:43 AM 09/11/2021    8:22 AM 09/08/2020    8:15 AM 09/08/2019    9:18 AM  PHQ 2/9 Scores  PHQ - 2 Score 0 0 0 0 0 0 0  PHQ- 9 Score       0    Fall Risk    10/24/2022    9:11 AM 10/09/2022    7:30  AM 06/11/2022    9:31 AM 02/26/2022    3:35 PM 10/19/2021   10:45 AM  Fall Risk   Falls in the past year? 0 0 0 0 0  Number falls in past yr: 0 0 0 0 0  Injury with Fall? 0 0 0 0 0  Risk for fall due to : Impaired vision No Fall Risks No Fall Risks No Fall Risks Impaired vision  Follow up Falls prevention discussed Falls prevention discussed  Falls evaluation completed Falls prevention discussed    FALL RISK PREVENTION PERTAINING TO THE HOME:  Any stairs in or around the home? No  If so, are there any without handrails? No  Home free of loose throw rugs in walkways, pet beds, electrical cords, etc? Yes  Adequate lighting in your home to reduce risk of falls? Yes   ASSISTIVE DEVICES UTILIZED TO PREVENT FALLS:  Life alert? Yes  Use of a cane, walker or w/c? No  Grab bars in the bathroom? Yes  Shower chair or bench in shower? Yes  Elevated toilet seat or a handicapped toilet? Yes   TIMED UP AND GO:  Was the test performed? No .   Cognitive Function:        10/24/2022    9:11 AM 10/19/2021   10:47 AM  6CIT Screen  What Year? 0 points 0 points  What month? 0 points 0 points  What time? 0 points 0 points  Count back from 20 0 points 0 points  Months in reverse 0 points 0 points  Repeat phrase 0 points 0 points  Total Score 0 points 0 points    Immunizations Immunization History  Administered Date(s) Administered   Fluad Quad(high Dose 65+) 03/07/2022   Influenza-Unspecified  03/16/2018, 03/24/2020, 04/12/2021   PFIZER(Purple Top)SARS-COV-2 Vaccination 07/13/2019, 08/03/2019, 03/28/2020   PNEUMOCOCCAL CONJUGATE-20 09/11/2021   Pfizer Covid-19 Vaccine Bivalent Booster 41yrs & up 09/28/2020, 03/15/2021, 03/28/2022   Rsv, Bivalent, Protein Subunit Rsvpref,pf Verdis Frederickson) 03/07/2022, 03/13/2022   Tdap 08/26/2018   Zoster Recombinat (Shingrix) 01/29/2019, 05/06/2019    TDAP status: Up to date  Flu Vaccine status: Up to date  Pneumococcal vaccine status: Up to date  Covid-19 vaccine status: Completed vaccines  Qualifies for Shingles Vaccine? Yes   Zostavax completed Yes   Shingrix Completed?: Yes  Screening Tests Health Maintenance  Topic Date Due   COVID-19 Vaccine (7 - 2023-24 season) 10/25/2022 (Originally 05/23/2022)   Hepatitis C Screening  06/12/2023 (Originally 12/01/1968)   INFLUENZA VACCINE  01/23/2023   Medicare Annual Wellness (AWV)  10/24/2023   COLONOSCOPY (Pts 45-72yrs Insurance coverage will need to be confirmed)  08/08/2024   DTaP/Tdap/Td (2 - Td or Tdap) 08/25/2028   Pneumonia Vaccine 58+ Years old  Completed   Zoster Vaccines- Shingrix  Completed   HPV VACCINES  Aged Out    Health Maintenance  There are no preventive care reminders to display for this patient.   Colorectal cancer screening: Type of screening: Colonoscopy. Completed 08/08/14. Repeat every 10 years  Additional Screening:  Hepatitis C Screening: does qualify;  Vision Screening: Recommended annual ophthalmology exams for early detection of glaucoma and other disorders of the eye. Is the patient up to date with their annual eye exam?  Yes  Who is the provider or what is the name of the office in which the patient attends annual eye exams? Dr Cathey Endow  If pt is not established with a provider, would they like to be referred to a provider to establish care? No .  Dental Screening: Recommended annual dental exams for proper oral hygiene  Community Resource Referral /  Chronic Care Management: CRR required this visit?  No   CCM required this visit?  No      Plan:     I have personally reviewed and noted the following in the patient's chart:   Medical and social history Use of alcohol, tobacco or illicit drugs  Current medications and supplements including opioid prescriptions. Patient is not currently taking opioid prescriptions. Functional ability and status Nutritional status Physical activity Advanced directives List of other physicians Hospitalizations, surgeries, and ER visits in previous 12 months Vitals Screenings to include cognitive, depression, and falls Referrals and appointments  In addition, I have reviewed and discussed with patient certain preventive protocols, quality metrics, and best practice recommendations. A written personalized care plan for preventive services as well as general preventive health recommendations were provided to patient.     Marzella Schlein, LPN   06/29/1094   Nurse Notes: none

## 2022-10-24 NOTE — Patient Instructions (Signed)
Jacob Hood , Thank you for taking time to come for your Medicare Wellness Visit. I appreciate your ongoing commitment to your health goals. Please review the following plan we discussed and let me know if I can assist you in the future.   These are the goals we discussed:  Goals      Patient Stated     Maintain healthy state     Patient Stated     Stay healthy physically and mentally         This is a list of the screening recommended for you and due dates:  Health Maintenance  Topic Date Due   COVID-19 Vaccine (7 - 2023-24 season) 10/25/2022*   Hepatitis C Screening: USPSTF Recommendation to screen - Ages 18-79 yo.  06/12/2023*   Flu Shot  01/23/2023   Medicare Annual Wellness Visit  10/24/2023   Colon Cancer Screening  08/08/2024   DTaP/Tdap/Td vaccine (2 - Td or Tdap) 08/25/2028   Pneumonia Vaccine  Completed   Zoster (Shingles) Vaccine  Completed   HPV Vaccine  Aged Out  *Topic was postponed. The date shown is not the original due date.    Advanced directives: Please bring a copy of your health care power of attorney and living will to the office at your convenience.  Conditions/risks identified: stay healthy mentally and physically   Next appointment: Follow up in one year for your annual wellness visit.   Preventive Care 74 Years and Older, Male  Preventive care refers to lifestyle choices and visits with your health care provider that can promote health and wellness. What does preventive care include? A yearly physical exam. This is also called an annual well check. Dental exams once or twice a year. Routine eye exams. Ask your health care provider how often you should have your eyes checked. Personal lifestyle choices, including: Daily care of your teeth and gums. Regular physical activity. Eating a healthy diet. Avoiding tobacco and drug use. Limiting alcohol use. Practicing safe sex. Taking low doses of aspirin every day. Taking vitamin and mineral  supplements as recommended by your health care provider. What happens during an annual well check? The services and screenings done by your health care provider during your annual well check will depend on your age, overall health, lifestyle risk factors, and family history of disease. Counseling  Your health care provider may ask you questions about your: Alcohol use. Tobacco use. Drug use. Emotional well-being. Home and relationship well-being. Sexual activity. Eating habits. History of falls. Memory and ability to understand (cognition). Work and work Astronomer. Screening  You may have the following tests or measurements: Height, weight, and BMI. Blood pressure. Lipid and cholesterol levels. These may be checked every 5 years, or more frequently if you are over 20 years old. Skin check. Lung cancer screening. You may have this screening every year starting at age 62 if you have a 30-pack-year history of smoking and currently smoke or have quit within the past 15 years. Fecal occult blood test (FOBT) of the stool. You may have this test every year starting at age 74. Flexible sigmoidoscopy or colonoscopy. You may have a sigmoidoscopy every 5 years or a colonoscopy every 10 years starting at age 74. Prostate cancer screening. Recommendations will vary depending on your family history and other risks. Hepatitis C blood test. Hepatitis B blood test. Sexually transmitted disease (STD) testing. Diabetes screening. This is done by checking your blood sugar (glucose) after you have not eaten for a while (  fasting). You may have this done every 1-3 years. Abdominal aortic aneurysm (AAA) screening. You may need this if you are a current or former smoker. Osteoporosis. You may be screened starting at age 44 if you are at high risk. Talk with your health care provider about your test results, treatment options, and if necessary, the need for more tests. Vaccines  Your health care provider  may recommend certain vaccines, such as: Influenza vaccine. This is recommended every year. Tetanus, diphtheria, and acellular pertussis (Tdap, Td) vaccine. You may need a Td booster every 10 years. Zoster vaccine. You may need this after age 60. Pneumococcal 13-valent conjugate (PCV13) vaccine. One dose is recommended after age 48. Pneumococcal polysaccharide (PPSV23) vaccine. One dose is recommended after age 74. Talk to your health care provider about which screenings and vaccines you need and how often you need them. This information is not intended to replace advice given to you by your health care provider. Make sure you discuss any questions you have with your health care provider. Document Released: 07/07/2015 Document Revised: 02/28/2016 Document Reviewed: 04/11/2015 Elsevier Interactive Patient Education  2017 [employer] Prevention in the Home Falls can cause injuries. They can happen to people of all ages. There are many things you can do to make your home safe and to help prevent falls. What can I do on the outside of my home? Regularly fix the edges of walkways and driveways and fix any cracks. Remove anything that might make you trip as you walk through a door, such as a raised step or threshold. Trim any bushes or trees on the path to your home. Use bright outdoor lighting. Clear any walking paths of anything that might make someone trip, such as rocks or tools. Regularly check to see if handrails are loose or broken. Make sure that both sides of any steps have handrails. Any raised decks and porches should have guardrails on the edges. Have any leaves, snow, or ice cleared regularly. Use sand or salt on walking paths during winter. Clean up any spills in your garage right away. This includes oil or grease spills. What can I do in the bathroom? Use night lights. Install grab bars by the toilet and in the tub and shower. Do not use towel bars as grab bars. Use  non-skid mats or decals in the tub or shower. If you need to sit down in the shower, use a plastic, non-slip stool. Keep the floor dry. Clean up any water that spills on the floor as soon as it happens. Remove soap buildup in the tub or shower regularly. Attach bath mats securely with double-sided non-slip rug tape. Do not have throw rugs and other things on the floor that can make you trip. What can I do in the bedroom? Use night lights. Make sure that you have a light by your bed that is easy to reach. Do not use any sheets or blankets that are too big for your bed. They should not hang down onto the floor. Have a firm chair that has side arms. You can use this for support while you get dressed. Do not have throw rugs and other things on the floor that can make you trip. What can I do in the kitchen? Clean up any spills right away. Avoid walking on wet floors. Keep items that you use a lot in easy-to-reach places. If you need to reach something above you, use a strong step stool that has a grab bar.  Keep electrical cords out of the way. Do not use floor polish or wax that makes floors slippery. If you must use wax, use non-skid floor wax. Do not have throw rugs and other things on the floor that can make you trip. What can I do with my stairs? Do not leave any items on the stairs. Make sure that there are handrails on both sides of the stairs and use them. Fix handrails that are broken or loose. Make sure that handrails are as long as the stairways. Check any carpeting to make sure that it is firmly attached to the stairs. Fix any carpet that is loose or worn. Avoid having throw rugs at the top or bottom of the stairs. If you do have throw rugs, attach them to the floor with carpet tape. Make sure that you have a light switch at the top of the stairs and the bottom of the stairs. If you do not have them, ask someone to add them for you. What else can I do to help prevent falls? Wear  shoes that: Do not have high heels. Have rubber bottoms. Are comfortable and fit you well. Are closed at the toe. Do not wear sandals. If you use a stepladder: Make sure that it is fully opened. Do not climb a closed stepladder. Make sure that both sides of the stepladder are locked into place. Ask someone to hold it for you, if possible. Clearly mark and make sure that you can see: Any grab bars or handrails. First and last steps. Where the edge of each step is. Use tools that help you move around (mobility aids) if they are needed. These include: Canes. Walkers. Scooters. Crutches. Turn on the lights when you go into a dark area. Replace any light bulbs as soon as they burn out. Set up your furniture so you have a clear path. Avoid moving your furniture around. If any of your floors are uneven, fix them. If there are any pets around you, be aware of where they are. Review your medicines with your doctor. Some medicines can make you feel dizzy. This can increase your chance of falling. Ask your doctor what other things that you can do to help prevent falls. This information is not intended to replace advice given to you by your health care provider. Make sure you discuss any questions you have with your health care provider. Document Released: 04/06/2009 Document Revised: 11/16/2015 Document Reviewed: 07/15/2014 Elsevier Interactive Patient Education  2017 [employer].

## 2022-11-01 DIAGNOSIS — L570 Actinic keratosis: Secondary | ICD-10-CM | POA: Diagnosis not present

## 2022-11-01 DIAGNOSIS — D1801 Hemangioma of skin and subcutaneous tissue: Secondary | ICD-10-CM | POA: Diagnosis not present

## 2022-11-01 DIAGNOSIS — L821 Other seborrheic keratosis: Secondary | ICD-10-CM | POA: Diagnosis not present

## 2022-11-01 DIAGNOSIS — L814 Other melanin hyperpigmentation: Secondary | ICD-10-CM | POA: Diagnosis not present

## 2022-11-01 DIAGNOSIS — L718 Other rosacea: Secondary | ICD-10-CM | POA: Diagnosis not present

## 2023-02-19 ENCOUNTER — Ambulatory Visit: Payer: Medicare Other | Admitting: Physician Assistant

## 2023-02-19 ENCOUNTER — Encounter: Payer: Self-pay | Admitting: Physician Assistant

## 2023-02-19 VITALS — BP 118/70 | HR 61 | Temp 97.3°F | Ht 68.0 in | Wt 157.5 lb

## 2023-02-19 DIAGNOSIS — R0981 Nasal congestion: Secondary | ICD-10-CM

## 2023-02-19 MED ORDER — DOXYCYCLINE HYCLATE 100 MG PO TABS: 100.0000 mg | ORAL_TABLET | Freq: Two times a day (BID) | ORAL | 0 refills | Status: DC

## 2023-02-19 NOTE — Progress Notes (Signed)
Jacob Hood is a 72 y.o. male here for a new problem.  History of Present Illness:   Chief Complaint  Patient presents with   Cough    Pt c/o dry cough, chest congestion, sinus pressure and headache off and on x 10 days. Took Ibuprofen with relief, Mucinex, Nasal flush and Flonase. Denies fever or chills    Cough    Cough Has had some ongoing cough from suspected sinusitis Lives in retirement community and has had multiple exposures to illness, including COVID He has taken several COVID tests, all negative Dull headache(s) at times Cough was at first when laying down but is now variable throughout the day No recent travel or sick contacts  Denies: fevers, chills, weakness, poor appetite, fatigue  Past Medical History:  Diagnosis Date   Hyperlipidemia      Social History   Tobacco Use   Smoking status: Former   Smokeless tobacco: Never  Advertising account planner   Vaping status: Never Used  Substance Use Topics   Alcohol use: Yes    Comment: Socially   Drug use: Never    Past Surgical History:  Procedure Laterality Date   TONSILLECTOMY      Family History  Problem Relation Age of Onset   Dementia Mother    Heart attack Father    Cancer Neg Hx     Allergies  Allergen Reactions   Penicillins     Current Medications:   Current Outpatient Medications:    Calcium Carbonate-Vitamin D 600-10 MG-MCG TABS, Take 1 tablet by mouth., Disp: , Rfl:    Carboxymethylcellulose Sodium (REFRESH LIQUIGEL OP), Apply to eye., Disp: , Rfl:    Cholecalciferol (VITAMIN D3) 125 MCG (5000 UT) CAPS, 150 capsules., Disp: , Rfl:    co-enzyme Q-10 30 MG capsule, Take 30 mg by mouth 3 (three) times daily., Disp: , Rfl:    doxycycline (VIBRA-TABS) 100 MG tablet, Take 1 tablet (100 mg total) by mouth 2 (two) times daily., Disp: 14 tablet, Rfl: 0   fluticasone (FLONASE) 50 MCG/ACT nasal spray, Place 2 sprays into both nostrils daily as needed for allergies or rhinitis., Disp: , Rfl:     simvastatin (ZOCOR) 20 MG tablet, TAKE 1 TABLET DAILY, Disp: 90 tablet, Rfl: 3   Review of Systems:   Review of Systems  Respiratory:  Positive for cough.    Negative unless otherwise specified per HPI.  Vitals:   Vitals:   02/19/23 0754  BP: 118/70  Pulse: 61  Temp: (!) 97.3 F (36.3 C)  TempSrc: Temporal  SpO2: 99%  Weight: 157 lb 8 oz (71.4 kg)  Height: 5\' 8"  (1.727 m)     Body mass index is 23.95 kg/m.  Physical Exam:   Physical Exam Vitals and nursing note reviewed.  Constitutional:      General: He is not in acute distress.    Appearance: He is well-developed. He is not ill-appearing or toxic-appearing.  HENT:     Head: Normocephalic and atraumatic.     Right Ear: Tympanic membrane, ear canal and external ear normal. Tympanic membrane is not erythematous, retracted or bulging.     Left Ear: Tympanic membrane, ear canal and external ear normal. Tympanic membrane is not erythematous, retracted or bulging.     Nose:     Right Sinus: Frontal sinus tenderness present. No maxillary sinus tenderness.     Left Sinus: Frontal sinus tenderness present. No maxillary sinus tenderness.     Mouth/Throat:     Pharynx: Uvula  midline. No posterior oropharyngeal erythema.  Eyes:     General: Lids are normal.     Conjunctiva/sclera: Conjunctivae normal.  Neck:     Trachea: Trachea normal.  Cardiovascular:     Rate and Rhythm: Normal rate and regular rhythm.     Heart sounds: Normal heart sounds, S1 normal and S2 normal.  Pulmonary:     Effort: Pulmonary effort is normal.     Breath sounds: Normal breath sounds. No decreased breath sounds, wheezing, rhonchi or rales.  Lymphadenopathy:     Cervical: No cervical adenopathy.  Skin:    General: Skin is warm and dry.  Neurological:     Mental Status: He is alert.  Psychiatric:        Speech: Speech normal.        Behavior: Behavior normal. Behavior is cooperative.     Assessment and Plan:   Sinus congestion No red  flags on exam.  Will initiate doxycycline per orders. Discussed taking medications as prescribed. Reviewed return precautions including worsening fever, SOB, worsening cough or other concerns. Push fluids and rest. I recommend that patient follow-up if symptoms worsen or persist despite treatment x 7-10 days, sooner if needed.     Jarold Motto, PA-C

## 2023-03-27 DIAGNOSIS — Z23 Encounter for immunization: Secondary | ICD-10-CM | POA: Diagnosis not present

## 2023-04-01 ENCOUNTER — Encounter: Payer: Self-pay | Admitting: Physician Assistant

## 2023-04-01 ENCOUNTER — Ambulatory Visit (INDEPENDENT_AMBULATORY_CARE_PROVIDER_SITE_OTHER): Payer: Medicare Other | Admitting: Physician Assistant

## 2023-04-01 ENCOUNTER — Ambulatory Visit: Payer: Medicare Other | Admitting: Physician Assistant

## 2023-04-01 VITALS — BP 126/80 | HR 63 | Temp 97.5°F | Ht 68.0 in | Wt 157.4 lb

## 2023-04-01 DIAGNOSIS — J310 Chronic rhinitis: Secondary | ICD-10-CM

## 2023-04-01 MED ORDER — IPRATROPIUM BROMIDE 0.03 % NA SOLN: 1.0000 | Freq: Two times a day (BID) | NASAL | 12 refills | Status: DC

## 2023-04-01 NOTE — Progress Notes (Signed)
Jacob Hood is a 72 y.o. male here for a follow up of a pre-existing problem. History of Present Illness:   Chief Complaint  Patient presents with   Sinus Problem    Pt still c/o sinus congestion, nasal drainage clear to light yellow, headaches.     HPI  Sinus Congestion: Reports persistence of cough and respiratory/sinus congestion since 8/28. Treated with Doxycycline, which he completed; notes mild improvement of symptoms.  Reports daily occurrence, varying severity. Describes head congestion worsens at night, sometimes his mucus is viscous and yellow and other times it's watery.  Reports last Thursday, 10/3, he received his flu and Covid vaccination.  Describes experiencing symptoms similar to when he received his first Covid vaccination: fever, chills, and aches onset 12 hours after vaccination and lasted 12 hours after onset.  States after his first Covid vaccination his symptom side effects were weaker with every following vaccine, until his flu and Covid vaccine. Notes he doesn't think he's received the vaccines together previously.   Expresses that he's never done allergy testing done before, but notes his mother had late onset allergies, and he does have 2 cats at home.   Denies allergy/nasal spray regimen, ear pain, crackling/popping (especially when flying), tenderness, other sick contacts.    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 Used  Substance Use Topics   Alcohol use: Yes    Comment: Socially   Drug use: Never   Past Surgical History:  Procedure Laterality Date   TONSILLECTOMY     Family History  Problem Relation Age of Onset   Dementia Mother    Heart attack Father    Cancer Neg Hx    Allergies  Allergen Reactions   Penicillins    Current Medications:   Current Outpatient Medications:    Calcium  Carbonate-Vitamin D 600-10 MG-MCG TABS, Take 1 tablet by mouth., Disp: , Rfl:    Carboxymethylcellulose Sodium (REFRESH LIQUIGEL OP), Apply to eye., Disp: , Rfl:    Cholecalciferol (VITAMIN D3) 125 MCG (5000 UT) CAPS, 150 capsules., Disp: , Rfl:    co-enzyme Q-10 30 MG capsule, Take 30 mg by mouth 3 (three) times daily., Disp: , Rfl:    ipratropium (ATROVENT) 0.03 % nasal spray, Place 1 spray into both nostrils every 12 (twelve) hours., Disp: 30 mL, Rfl: 12   simvastatin (ZOCOR) 20 MG tablet, TAKE 1 TABLET DAILY, Disp: 90 tablet, Rfl: 3  Review of Systems:   ROS See pertinent positives and negatives as per the HPI.  Vitals:   Vitals:   04/01/23 1056  BP: 126/80  Pulse: 63  Temp: (!) 97.5 F (36.4 C)  TempSrc: Temporal  SpO2: 98%  Weight: 157 lb 6.1 oz (71.4 kg)  Height: 5\' 8"  (1.727 m)     Body mass index is 23.93 kg/m.  Physical Exam:   Physical Exam Vitals and nursing note reviewed.  Constitutional:      General: He is not in acute distress.    Appearance: He is well-developed. He is not ill-appearing or toxic-appearing.  HENT:     Head: Normocephalic and atraumatic.     Right Ear: Tympanic membrane, ear canal and external ear normal. Tympanic membrane is not erythematous, retracted or bulging.     Left Ear: Tympanic membrane, ear canal and external ear normal. Tympanic membrane is not  erythematous, retracted or bulging.     Nose: Mucosal edema and rhinorrhea present.     Right Sinus: No maxillary sinus tenderness or frontal sinus tenderness.     Left Sinus: No maxillary sinus tenderness or frontal sinus tenderness.     Mouth/Throat:     Pharynx: Uvula midline. No posterior oropharyngeal erythema.  Eyes:     General: Lids are normal.     Conjunctiva/sclera: Conjunctivae normal.  Neck:     Trachea: Trachea normal.  Cardiovascular:     Rate and Rhythm: Normal rate and regular rhythm.     Pulses: Normal pulses.     Heart sounds: Normal heart sounds, S1 normal and S2  normal.  Pulmonary:     Effort: Pulmonary effort is normal.     Breath sounds: Normal breath sounds. No decreased breath sounds, wheezing, rhonchi or rales.  Lymphadenopathy:     Cervical: No cervical adenopathy.  Skin:    General: Skin is warm and dry.  Neurological:     Mental Status: He is alert and oriented to person, place, and time.  Psychiatric:        Speech: Speech normal.        Behavior: Behavior normal. Behavior is cooperative.     Assessment and Plan:   Rhinitis, unspecified type No red flags on exam. Recommend nasal saline spray and atrovent nasal spray to help clear up nasal congestion and post nasal drip. Offered referral to allergy but he would like to see if this intervention helps first and then will reach out via MyChart if he would like this. I do not feel as though he needs antibiotic(s) at this time. May need to consider sinus CT without contrast in future if sinus congestion returns/persist vs ENT referral.  I,Emily Lagle,acting as a scribe for Jarold Motto, PA.,have documented all relevant documentation on the behalf of Jarold Motto, PA,as directed by  Jarold Motto, PA while in the presence of Jarold Motto, [place].  I, Jarold Motto, [place], have reviewed all documentation for this visit. The documentation on 04/01/23 for the exam, diagnosis, procedures, and orders are all accurate and complete.  Jarold Motto, PA-C

## 2023-04-03 NOTE — Telephone Encounter (Signed)
Left patient vm to return call

## 2023-04-04 NOTE — Telephone Encounter (Signed)
I have spoken with patient in regard. 

## 2023-05-08 ENCOUNTER — Encounter: Payer: Self-pay | Admitting: Physician Assistant

## 2023-05-09 ENCOUNTER — Encounter: Payer: Self-pay | Admitting: Physician Assistant

## 2023-05-09 DIAGNOSIS — J310 Chronic rhinitis: Secondary | ICD-10-CM

## 2023-05-09 NOTE — Telephone Encounter (Signed)
Please advise what referrals to order.

## 2023-05-15 ENCOUNTER — Encounter (INDEPENDENT_AMBULATORY_CARE_PROVIDER_SITE_OTHER): Payer: Self-pay | Admitting: Otolaryngology

## 2023-06-09 ENCOUNTER — Encounter: Payer: Self-pay | Admitting: Family Medicine

## 2023-06-09 ENCOUNTER — Telehealth: Payer: Medicare Other | Admitting: Family Medicine

## 2023-06-09 DIAGNOSIS — J329 Chronic sinusitis, unspecified: Secondary | ICD-10-CM | POA: Diagnosis not present

## 2023-06-09 MED ORDER — LEVOFLOXACIN 500 MG PO TABS: 500.0000 mg | ORAL_TABLET | Freq: Every day | ORAL | 0 refills | Status: AC

## 2023-06-09 NOTE — Progress Notes (Signed)
   Jacob Hood is a 72 y.o. male who presents today for a virtual office visit.  Assessment/Plan:  Sinusitis No red flags.  Discussed limitations of virtual visit and inability to perform physical exam.  It is likely that his symptoms are due to a viral URI however this has been a recurrent issue for him for the last several months.  He does have an ENT referral pending.  Will send in pocket prescription for Levaquin with instruction to not start unless symptoms fail to improve over the next several days.  He has a penicillin allergy and we cannot send in Augmentin due to this.  We encouraged hydration.  He can continue using Atrovent nasal spray and other over-the-counter meds as well.  Will defer further management to ENT though he will let us know if he has any significant worsening symptoms before he can follow-up with them.    Subjective:  HPI:  See A/P for status of chronic conditions.  Patient is here today for 1 week of cough and congestion. HE has had intermittent issues with sinus congestion for the last several months and has been seen here a few times by different providers. Symptoms did worsen within the last week.  Covid test was negative. He has been using atrovent with some improvement.  No fevers or chills.  He was referred to ENT for recurrent sinusitis but cannot see them for a few more weeks.       Objective/Observations  Physical Exam: Gen: NAD, resting comfortably Pulm: Normal work of breathing Neuro: Grossly normal, moves all extremities Psych: Normal affect and thought content  Virtual Visit via Video   I connected with Jacob Hood on 06/09/23 at  2:20 PM EST by a video enabled telemedicine application and verified that I am speaking with the correct person using two identifiers. The limitations of evaluation and management by telemedicine and the availability of in person appointments were discussed. The patient expressed understanding and agreed to proceed.    Patient location: Home Provider location: [place] [facility] [employer] [facility] Persons participating in the virtual visit: Myself and Patient     Katina Degree. Jimmey Ralph, MD 06/09/2023 2:59 PM

## 2023-06-13 ENCOUNTER — Institutional Professional Consult (permissible substitution) (INDEPENDENT_AMBULATORY_CARE_PROVIDER_SITE_OTHER): Payer: Medicare Other

## 2023-06-17 ENCOUNTER — Institutional Professional Consult (permissible substitution) (INDEPENDENT_AMBULATORY_CARE_PROVIDER_SITE_OTHER): Payer: Medicare Other

## 2023-06-27 ENCOUNTER — Ambulatory Visit (INDEPENDENT_AMBULATORY_CARE_PROVIDER_SITE_OTHER): Payer: Medicare Other | Admitting: Otolaryngology

## 2023-06-27 ENCOUNTER — Encounter (INDEPENDENT_AMBULATORY_CARE_PROVIDER_SITE_OTHER): Payer: Self-pay

## 2023-06-27 VITALS — BP 153/74 | HR 71 | Resp 18 | Wt 157.0 lb

## 2023-06-27 DIAGNOSIS — J343 Hypertrophy of nasal turbinates: Secondary | ICD-10-CM | POA: Diagnosis not present

## 2023-06-27 DIAGNOSIS — J0181 Other acute recurrent sinusitis: Secondary | ICD-10-CM

## 2023-06-27 DIAGNOSIS — J342 Deviated nasal septum: Secondary | ICD-10-CM | POA: Diagnosis not present

## 2023-06-27 DIAGNOSIS — R0981 Nasal congestion: Secondary | ICD-10-CM

## 2023-06-27 DIAGNOSIS — R0982 Postnasal drip: Secondary | ICD-10-CM

## 2023-06-27 MED ORDER — AZELASTINE HCL 0.1 % NA SOLN: 2.0000 | Freq: Two times a day (BID) | NASAL | 6 refills | Status: DC

## 2023-06-27 MED ORDER — FLUTICASONE PROPIONATE 50 MCG/ACT NA SUSP
2.0000 | Freq: Every day | NASAL | 6 refills | Status: DC
Start: 2023-06-27 — End: 2023-10-13

## 2023-06-27 NOTE — Progress Notes (Addendum)
 Dear Dr. Job, Here is my assessment for our mutual patient, Jacob Hood. Thank you for allowing me the opportunity to care for your patient. Please do not hesitate to contact me should you have any other questions. Sincerely, Dr. Eldora Blanch  Otolaryngology Clinic Note  HISTORY: Jacob Hood is a 73 y.o. male kindly referred by Dr. Job for evaluation of sinus complaints and cough.  He reports that he has episodes of sinusitis (presumed) - multiple times per year (see below 3-4 this year) - headache, nasal congestion, cough. Both sides. Occasional facial pressure (max, frontal). Generally mucoid drainage. Ongoing for the past 2-3 years. He got over a URI recently (Christmas) and now does not have any nasal symptoms.  He denies current congestion, anterior or posterior rhinorrhea, pressure/pain in face/sinus, sense of smell intact Between episodes he does feel normal. He has tried saline spray, atrovent , neti pot, flonase , zyrtec . Has not tried astelin . He has had multiple courses of antibiotics as well (doxycycline  and levaquin  over psat few months). He has not had significant improvement with anything but he just feels like things run their course over a few weeks.   Allergy testing has not been done. No CT sinus. No previous sinonasal surgery. No nasal obstruction normally. Last abx was doxycycline  in the fall.  He denies any AR symptoms, or any seasonal allergy symptoms. He does not think that allergy season triggers his symptoms.  No reflux symptoms  He does report mother developing allergies in 43s - food related, not environment  He is not using any nasal medications currently.  AP/AC: no  H&N Surgery: T&A (child)  PMHx: healthy  Tobacco: 10 pack year; Lives in [place], [place]  RADIOGRAPHIC EVALUATION AND INDEPENDENT REVIEW OF OTHER RECORDS:: [name] 05/07/2021 - Sinusitis Dx; Rx: Doxy, Mucinex, Saline Spray; Headache, Cough, Congestion and Pain Lucie Job 06/12/2021 - Headache, sinus pressure, cough; no fevers/chills; Dx: Sinusitis, Rx: azithromycin  Lucie Job 07/05/2021: Sinus issues f/u, continued sx; Dx: cough; Rx: flonase , conservative Rx Lucie Job 02/26/2022: Hoarse voice, URI; Headache, cough, feels like sinus HA; Dx: Viral URI; Rx: conservative Rx but rescue doxy Lucie Job 03/20/2022: presistent Sx; ongoing frontal sinus pressure; Dx: sinusitis; Rx: neti pot, nasal spray, atrovent  Lucie Job (02/19/2023, 04/01/2023, 05/08/2023, 05/09/2023, 06/09/2023 reviewed and chronologically speaking): cough, multiple URI exposures, headache which persisted including congestion and discolored drainage despite doxycycline  and Levaquin , atroent, saline spray; No prior allergy test: Rx: doxy; atrovent , saline spray - slow improvement Labs: CMP and CBC 09/2022: no kidney disease; no leukocytosis  Past Medical History:  Diagnosis Date   Hyperlipidemia    Past Surgical History:  Procedure Laterality Date   TONSILLECTOMY     Family History  Problem Relation Age of Onset   Dementia Mother    Heart attack Father    Cancer Neg Hx    Social History   Tobacco Use   Smoking status: Former   Smokeless tobacco: Never  Substance Use Topics   Alcohol  use: Yes    Comment: Socially   Allergies  Allergen Reactions   Penicillins    Current Outpatient Medications  Medication Sig Dispense Refill   azelastine  (ASTELIN ) 0.1 % nasal spray Place 2 sprays into both nostrils 2 (two) times daily. Use in each nostril as directed 30 mL 6   Calcium Carbonate-Vitamin D 600-10 MG-MCG TABS Take 1 tablet by mouth.     Carboxymethylcellulose Sodium (REFRESH LIQUIGEL OP) Apply to eye.     Cholecalciferol (VITAMIN D3) 125 MCG (  5000 UT) CAPS 150 capsules.     co-enzyme Q-10 30 MG capsule Take 30 mg by mouth 3 (three) times daily.     fluticasone  (FLONASE ) 50 MCG/ACT nasal spray Place 2 sprays into both nostrils daily. 11 mL 6   ipratropium  (ATROVENT ) 0.03 % nasal spray Place 1 spray into both nostrils every 12 (twelve) hours. 30 mL 12   simvastatin  (ZOCOR ) 20 MG tablet TAKE 1 TABLET DAILY 90 tablet 3   No current facility-administered medications for this visit.   BP (!) 153/74 (BP Location: Right Arm, Patient Position: Sitting, Cuff Size: Normal)   Pulse 71   Resp 18   Wt 157 lb (71.2 kg)   SpO2 97%   BMI 23.87 kg/m   PHYSICAL EXAM:  BP (!) 153/74 (BP Location: Right Arm, Patient Position: Sitting, Cuff Size: Normal)   Pulse 71   Resp 18   Wt 157 lb (71.2 kg)   SpO2 97%   BMI 23.87 kg/m    Salient findings:  CN II-XII intact  Bilateral EAC clear and TM intact with well pneumatized middle ear spaces Nose: Anterior rhinoscopy reveals septal deviation right with spur with modest turbinate hypertrophy.  Nasal endoscopy was indicated to better evaluate the nose and paranasal sinuses, given the patient's history and exam findings, and is detailed below. No lesions of oral cavity/oropharynx No obviously palpable neck masses/lymphadenopathy/thyromegaly No respiratory distress or stridor   PROCEDURE: Diagnostic Nasal Endoscopy Pre-procedure diagnosis: Concern for chronic sinusitis Post-procedure diagnosis: same Indication: See pre-procedure diagnosis and physical exam above Complications: None apparent EBL: 0 mL Anesthesia: Lidocaine 4% and topical decongestant was topically sprayed in each nasal cavity  Description of Procedure:  Patient was identified. A rigid 30 degree endoscope was utilized to evaluate the sinonasal cavities, mucosa, sinus ostia and turbinates and septum.  Overall, signs of mucosal inflammation are not noted.  No mucopurulence, polyps, or masses noted.   Right Middle meatus: clear Right SE Recess: clear Left MM: clear Left SE Recess: clear Photodocumentation was obtained.     CPT CODE -- 68768 - Mod 25   ASSESSMENT:  73 yo without contributory PMHx with:  1. Other acute recurrent  sinusitis   2. Nasal septal deviation   3. Post-nasal drip   4. Hypertrophy of both inferior nasal turbinates   5. Nasal congestion    DDX here most likely includes recurrent acute sinusitis; he feels good today, so don't feel strongly re: abx and steroids. Not on any regular regimen. Do not think allergy related since he does not have typical AR symptoms and this is just episodic and given possibility of senescence.   PLAN: We've discussed issues and options today.  We reviewed the nasal endoscopy images together.  The risks, benefits and alternatives were discussed and questions answered.  He has elected to proceed with:  1) Use flonase  nasal spray BID 2) Start astelin  nasal spray BID 3) Hold off on steroids and abx given asx 4) Daily nasal rinse 5) CT Sinus to r/o any anatomic cause 2) Follow-up in 6 weeks -- sooner as necessary.  See below regarding exact medications prescribed this encounter including dosages and route: Meds ordered this encounter  Medications   fluticasone  (FLONASE ) 50 MCG/ACT nasal spray    Sig: Place 2 sprays into both nostrils daily.    Dispense:  11 mL    Refill:  6   azelastine  (ASTELIN ) 0.1 % nasal spray    Sig: Place 2 sprays into both nostrils 2 (  two) times daily. Use in each nostril as directed    Dispense:  30 mL    Refill:  6     Thank you for allowing me the opportunity to care for your patient. Please do not hesitate to contact me should you have any other questions.  Sincerely, Eldora Blanch, MD Otolarynoglogist (ENT), [facility] Phone: [phone] Fax: [phone]   06/27/2023, 1:14 PM   I have personally spent 46 minutes involved in face-to-face and non-face-to-face activities for this patient on the day of the visit.  Professional time spent excludes any procedures performed but includes the following activities, in addition to those noted in the documentation: preparing to see the patient (review of outside  documentation and results), performing a medically appropriate examination, counseling and educating, ordering medications (flonase /astelin ), referring and communicating with other healthcare professionals, documenting clinical information in the electronic or other health record

## 2023-06-27 NOTE — Patient Instructions (Signed)
 Use flonase  nasal spray two times per day twice daily Use astelin  nasal spray two times per day twice daily - can use back to back Rinse sinuses daily I have ordered an imaging study for you to complete prior to your next visit. Please call [facility] Scheduling at [phone] to schedule your imaging if you have not received a call within 24 hours. If you are unable to complete your imaging study prior to your next scheduled visit please call our office to let us  know.   Aurel[facility] Nasal Saline Rinse   - start nasal saline rinses with NeilMed Bottle available over the counter    Nasal Saline Irrigation instructions: If you choose to make your own salt water solution, You will need: Salt (kosher, canning, or pickling salt) Baking soda Nasal irrigation bottle (i.e. Aurel[facility] Med Sinus Rinse) Measuring spoon ( teaspoon) Distilled / boiled water   Mix solution Mix 1 teaspoon of salt, 1/2 teaspoon of baking soda and 1 cup of water into irrigation bottle ** May use saline packet instead of homemade recipe for this step if you prefer If medicine was prescribed to be mixed with solution, place this into bottle Examples 2 inches of 2% mupirocin ointment Budesonide solution Position your head: Lean over sink (about 45 degrees) Rotate head (about 45 degrees) so that one nostril is above the other Irrigate Insert tip of irrigation bottle into upper nostril so it forms a comfortable seal Irrigate while breathing through your mouth May remove the straw from the bottle in order to irrigate the entire solution (important if medicine was added) Exhale through nose when finished and blow nose as necessary  Repeat on opposite side with other 1/2 of solution (120 mL) or remake solution if all 240 mL was used on first side Wash irrigation bottle regularly, replace every 3 months

## 2023-07-01 ENCOUNTER — Institutional Professional Consult (permissible substitution) (INDEPENDENT_AMBULATORY_CARE_PROVIDER_SITE_OTHER): Payer: Medicare Other

## 2023-07-18 ENCOUNTER — Ambulatory Visit (HOSPITAL_BASED_OUTPATIENT_CLINIC_OR_DEPARTMENT_OTHER)
Admission: RE | Admit: 2023-07-18 | Discharge: 2023-07-18 | Disposition: A | Discharge status: Home / Self Care | Payer: Medicare Other | Source: Ambulatory Visit | Attending: Otolaryngology | Admitting: Otolaryngology

## 2023-07-18 DIAGNOSIS — R0982 Postnasal drip: Secondary | ICD-10-CM

## 2023-07-18 DIAGNOSIS — J343 Hypertrophy of nasal turbinates: Secondary | ICD-10-CM

## 2023-07-18 DIAGNOSIS — J342 Deviated nasal septum: Secondary | ICD-10-CM | POA: Diagnosis not present

## 2023-07-18 DIAGNOSIS — J0181 Other acute recurrent sinusitis: Secondary | ICD-10-CM

## 2023-07-18 DIAGNOSIS — J0191 Acute recurrent sinusitis, unspecified: Secondary | ICD-10-CM | POA: Diagnosis not present

## 2023-07-18 DIAGNOSIS — R0981 Nasal congestion: Secondary | ICD-10-CM

## 2023-07-18 DIAGNOSIS — M799 Soft tissue disorder, unspecified: Secondary | ICD-10-CM | POA: Diagnosis not present

## 2023-08-08 ENCOUNTER — Telehealth (INDEPENDENT_AMBULATORY_CARE_PROVIDER_SITE_OTHER): Payer: Self-pay | Admitting: Otolaryngology

## 2023-08-08 NOTE — Telephone Encounter (Signed)
Reminder Call: Date: 08/11/2023 Status: Sch  Time: 8:00 AM [address] [place], [place] [address]  TEXT YES-08/04/2023 9:07 AM

## 2023-08-11 ENCOUNTER — Ambulatory Visit (INDEPENDENT_AMBULATORY_CARE_PROVIDER_SITE_OTHER): Payer: Medicare Other | Admitting: Otolaryngology

## 2023-08-11 VITALS — BP 126/79 | HR 61

## 2023-08-11 DIAGNOSIS — J343 Hypertrophy of nasal turbinates: Secondary | ICD-10-CM | POA: Diagnosis not present

## 2023-08-11 DIAGNOSIS — R0981 Nasal congestion: Secondary | ICD-10-CM | POA: Diagnosis not present

## 2023-08-11 DIAGNOSIS — R0982 Postnasal drip: Secondary | ICD-10-CM | POA: Diagnosis not present

## 2023-08-11 DIAGNOSIS — J0181 Other acute recurrent sinusitis: Secondary | ICD-10-CM | POA: Diagnosis not present

## 2023-08-11 DIAGNOSIS — J342 Deviated nasal septum: Secondary | ICD-10-CM | POA: Diagnosis not present

## 2023-08-11 NOTE — Progress Notes (Signed)
Dear Dr. Jimmey Ralph, Here is my assessment for our mutual patient, Jacob Hood. Thank you for allowing me the opportunity to care for your patient. Please do not hesitate to contact me should you have any other questions. Sincerely, Dr. Jovita Kussmaul  Otolaryngology Clinic Note  HISTORY: Jacob Hood is a 73 y.o. male kindly referred by Dr. Jimmey Ralph for evaluation of sinus complaints and cough.  Initial visit (06/2023): He reports that he has episodes of sinusitis (presumed) - multiple times per year (see below 3-4 this year) - headache, nasal congestion, cough. Both sides. Occasional facial pressure (max, frontal). Generally mucoid drainage. Ongoing for the past 2-3 years. He got over a URI recently (Christmas) and now does not have any nasal symptoms.  He denies current congestion, anterior or posterior rhinorrhea, pressure/pain in face/sinus, sense of smell intact Between episodes he does feel normal. He has tried saline spray, atrovent, neti pot, flonase, zyrtec. Has not tried astelin. He has had multiple courses of antibiotics as well (doxycycline and levaquin over psat few months). He has not had significant improvement with anything but he just feels like things run their course over a few weeks.   Allergy testing has not been done. No CT sinus. No previous sinonasal surgery. No nasal obstruction normally. Last abx was doxycycline in the fall.  He denies any AR symptoms, or any seasonal allergy symptoms. He does not think that allergy season triggers his symptoms.  No reflux symptoms  He does report mother developing allergies in 89s - food related, not environment  He is not using any nasal medications currently.  --------------------------------------------------------- 08/11/2023 Returns for f/u after medical management. He reports that he has overall done fairly well. Sprays seem to help some. He reports that he continues to have congestion and some post nasal drip. No pain/pressure  in sinuses. He is currently using astelin and flonase. We discussed septoplasty/turbinate reduction. ----------------------------------------------------------  AP/AC: no  H&N Surgery: T&A (child)  PMHx: healthy  Tobacco: 10 pack year; Lives in [place], [place]  RADIOGRAPHIC EVALUATION AND INDEPENDENT REVIEW OF OTHER RECORDS:: Joycelyn Man 05/07/2021 - Sinusitis Dx; Rx: Doxy, Mucinex, Saline Spray; Headache, Cough, Congestion and Pain Jarold Motto 06/12/2021 - Headache, sinus pressure, cough; no fevers/chills; Dx: Sinusitis, Rx: azithromycin Jarold Motto 07/05/2021: Sinus issues f/u, continued sx; Dx: cough; Rx: flonase, conservative Rx Jarold Motto 02/26/2022: Hoarse voice, URI; Headache, cough, feels like sinus HA; Dx: Viral URI; Rx: conservative Rx but rescue doxy Jarold Motto 03/20/2022: presistent Sx; ongoing frontal sinus pressure; Dx: sinusitis; Rx: neti pot, nasal spray, atrovent Jarold Motto (02/19/2023, 04/01/2023, 05/08/2023, 05/09/2023, 06/09/2023 reviewed and chronologically speaking): cough, multiple URI exposures, headache which persisted including congestion and discolored drainage despite doxycycline and Levaquin, atroent, saline spray; No prior allergy test: Rx: doxy; atrovent, saline spray - slow improvement Labs: CMP and CBC 09/2022: no kidney disease; no leukocytosis CT Sinus 07/18/2023 independently reviewed and agree with read: right septal deviation, b/l inferior turbinate hypertrophy; left infundibulum narrowed, possible accessory ostium. No significant sinonasal disease.  Past Medical History:  Diagnosis Date   Hyperlipidemia    Past Surgical History:  Procedure Laterality Date   TONSILLECTOMY     Family History  Problem Relation Age of Onset   Dementia Mother    Heart attack Father    Cancer Neg Hx    Social History   Tobacco Use   Smoking status: Former   Smokeless tobacco: Never  Substance Use Topics   Alcohol use: Yes    Comment:  Socially   Allergies  Allergen Reactions   Penicillins    Current Outpatient Medications  Medication Sig Dispense Refill   azelastine (ASTELIN) 0.1 % nasal spray Place 2 sprays into both nostrils 2 (two) times daily. Use in each nostril as directed 30 mL 6   Calcium Carbonate-Vitamin D 600-10 MG-MCG TABS Take 1 tablet by mouth.     Carboxymethylcellulose Sodium (REFRESH LIQUIGEL OP) Apply to eye.     Cholecalciferol (VITAMIN D3) 125 MCG (5000 UT) CAPS 150 capsules.     co-enzyme Q-10 30 MG capsule Take 30 mg by mouth 3 (three) times daily.     fluticasone (FLONASE) 50 MCG/ACT nasal spray Place 2 sprays into both nostrils daily. 11 mL 6   ipratropium (ATROVENT) 0.03 % nasal spray Place 1 spray into both nostrils every 12 (twelve) hours. 30 mL 12   simvastatin (ZOCOR) 20 MG tablet TAKE 1 TABLET DAILY 90 tablet 3   No current facility-administered medications for this visit.   BP 126/79 (BP Location: Left Arm, Patient Position: Sitting, Cuff Size: Normal)   Pulse 61   SpO2 94%   PHYSICAL EXAM:  BP 126/79 (BP Location: Left Arm, Patient Position: Sitting, Cuff Size: Normal)   Pulse 61   SpO2 94%    Salient findings:  CN II-XII intact  Bilateral EAC clear and TM intact with well pneumatized middle ear spaces Nose: Anterior rhinoscopy reveals septal deviation right with spur with modest turbinate hypertrophy No lesions of oral cavity/oropharynx No obviously palpable neck masses/lymphadenopathy/thyromegaly No respiratory distress or stridor   PROCEDURE: Diagnostic Nasal Endoscopy (Not today)  Description of Procedure:  Patient was identified. A rigid 30 degree endoscope was utilized to evaluate the sinonasal cavities, mucosa, sinus ostia and turbinates and septum.  Overall, signs of mucosal inflammation are not noted.  No mucopurulence, polyps, or masses noted.   Right Middle meatus: clear Right SE Recess: clear Left MM: clear Left SE Recess: clear Photodocumentation was  obtained.     CPT CODE -- [address] - Mod 25   ASSESSMENT:  73 yo without contributory PMHx with:  1. Other acute recurrent sinusitis   2. Nasal septal deviation   3. Post-nasal drip   4. Nasal congestion   5. Hypertrophy of both inferior nasal turbinates    DDX here most likely includes recurrent acute sinusitis given CT findings. No exacerbation since patient saw me. Do not think allergy related since he does not have typical AR symptoms and this is just episodic and given possibility of senescence.  He also does have a septal deviation and his congestion is likely due to this. We discussed septo/turbs and likely left max/ant ethmoid and offered that for obstruction and recurrent acute but he wishes to monitor for now given some improvement with sprays.   PLAN: We've discussed issues and options today.  The risks, benefits and alternatives were discussed and questions answered.  He has elected to proceed with:  1) Continue flonase nasal spray BID 2) Continue astelin nasal spray BID 3) Daily nasal rinse 2) Follow-up - discussed w/pt - f/u PRN  See below regarding exact medications prescribed this encounter including dosages and route: No orders of the defined types were placed in this encounter.    Thank you for allowing me the opportunity to care for your patient. Please do not hesitate to contact me should you have any other questions.  Sincerely, Jovita Kussmaul, MD Otolarynoglogist (ENT), [facility] Phone: [phone] Fax: [phone]   08/11/2023, 8:17  AM   MDM:  Level 4: 99214 Complexity/Problems addressed: mod - multiple problems, stable, chronic Data complexity: mod - independent review and interpretation of CT imaging - Morbidity: low  - Prescription Drug prescribed or managed: no

## 2023-08-19 DIAGNOSIS — H2513 Age-related nuclear cataract, bilateral: Secondary | ICD-10-CM | POA: Diagnosis not present

## 2023-08-19 DIAGNOSIS — H524 Presbyopia: Secondary | ICD-10-CM | POA: Diagnosis not present

## 2023-09-05 ENCOUNTER — Other Ambulatory Visit: Payer: Self-pay

## 2023-09-05 ENCOUNTER — Encounter: Payer: Self-pay | Admitting: Family Medicine

## 2023-09-05 MED ORDER — SIMVASTATIN 20 MG PO TABS
20.0000 mg | ORAL_TABLET | Freq: Every day | ORAL | 3 refills | Status: AC
Start: 2023-09-05 — End: ?

## 2023-09-24 DIAGNOSIS — Z23 Encounter for immunization: Secondary | ICD-10-CM | POA: Diagnosis not present

## 2023-10-10 ENCOUNTER — Ambulatory Visit: Payer: Medicare Other | Admitting: Family Medicine

## 2023-10-13 ENCOUNTER — Encounter: Payer: Self-pay | Admitting: Family Medicine

## 2023-10-13 ENCOUNTER — Ambulatory Visit (INDEPENDENT_AMBULATORY_CARE_PROVIDER_SITE_OTHER): Payer: Medicare Other | Admitting: Family Medicine

## 2023-10-13 VITALS — BP 118/67 | HR 62 | Temp 97.3°F | Wt 154.4 lb

## 2023-10-13 DIAGNOSIS — R6889 Other general symptoms and signs: Secondary | ICD-10-CM | POA: Insufficient documentation

## 2023-10-13 DIAGNOSIS — Z1159 Encounter for screening for other viral diseases: Secondary | ICD-10-CM

## 2023-10-13 DIAGNOSIS — Z125 Encounter for screening for malignant neoplasm of prostate: Secondary | ICD-10-CM

## 2023-10-13 DIAGNOSIS — R030 Elevated blood-pressure reading, without diagnosis of hypertension: Secondary | ICD-10-CM | POA: Diagnosis not present

## 2023-10-13 DIAGNOSIS — R351 Nocturia: Secondary | ICD-10-CM | POA: Diagnosis not present

## 2023-10-13 DIAGNOSIS — R739 Hyperglycemia, unspecified: Secondary | ICD-10-CM

## 2023-10-13 DIAGNOSIS — E785 Hyperlipidemia, unspecified: Secondary | ICD-10-CM | POA: Diagnosis not present

## 2023-10-13 LAB — COMPREHENSIVE METABOLIC PANEL WITH GFR
ALT: 17 U/L (ref 0–53)
AST: 18 U/L (ref 0–37)
Albumin: 4.4 g/dL (ref 3.5–5.2)
Alkaline Phosphatase: 38 U/L — ABNORMAL LOW (ref 39–117)
BUN: 15 mg/dL (ref 6–23)
CO2: 27 meq/L (ref 19–32)
Calcium: 9.5 mg/dL (ref 8.4–10.5)
Chloride: 103 meq/L (ref 96–112)
Creatinine, Ser: 1.07 mg/dL (ref 0.40–1.50)
GFR: 68.19 mL/min (ref >60.00)
Glucose, Bld: 96 mg/dL (ref 70–99)
Potassium: 4.1 meq/L (ref 3.5–5.1)
Sodium: 139 meq/L (ref 135–145)
Total Bilirubin: 0.6 mg/dL (ref 0.2–1.2)
Total Protein: 6.9 g/dL (ref 6.0–8.3)

## 2023-10-13 LAB — CBC
HCT: 43 % (ref 39.0–52.0)
Hemoglobin: 14.1 g/dL (ref 13.0–17.0)
MCHC: 32.8 g/dL (ref 30.0–36.0)
MCV: 91.8 fl (ref 78.0–100.0)
Platelets: 223 10*3/uL (ref 150.0–400.0)
RBC: 4.69 Mil/uL (ref 4.22–5.81)
RDW: 14.1 % (ref 11.5–15.5)
WBC: 7.7 10*3/uL (ref 4.0–10.5)

## 2023-10-13 LAB — LIPID PANEL
Cholesterol: 155 mg/dL (ref 0–200)
HDL: 36.7 mg/dL — ABNORMAL LOW (ref >39.00)
LDL Cholesterol: 58 mg/dL (ref 0–99)
NonHDL: 118.62
Total CHOL/HDL Ratio: 4
Triglycerides: 301 mg/dL — ABNORMAL HIGH (ref 0.0–149.0)
VLDL: 60.2 mg/dL — ABNORMAL HIGH (ref 0.0–40.0)

## 2023-10-13 LAB — HEMOGLOBIN A1C: Hgb A1c MFr Bld: 6 % (ref 4.6–6.5)

## 2023-10-13 LAB — PSA, MEDICARE: PSA: 1.08 ng/mL (ref 0.10–4.00)

## 2023-10-13 LAB — TSH: TSH: 1.71 u[IU]/mL (ref 0.35–5.50)

## 2023-10-13 LAB — VITAMIN B12: Vitamin B-12: 166 pg/mL — ABNORMAL LOW (ref 211–911)
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
## 2023-10-13 NOTE — Assessment & Plan Note (Signed)
 Blood pressure at goal today without meds.

## 2023-10-13 NOTE — Assessment & Plan Note (Signed)
 No red flags.  Normal mini cog today.  Will check labs.  Does have family history of Alzheimer's in his mother.  We did discuss referral to see neurology however he would like to hold off this for now.  We did discuss importance of keeping his mind active.  He is still working part-time and believes he is doing a good job with this.  He will let us  know if any new symptoms arise or if he changes his mind about referral.

## 2023-10-13 NOTE — Assessment & Plan Note (Signed)
 Check A1c.  He is doing great job with diet and exercise.

## 2023-10-13 NOTE — Assessment & Plan Note (Signed)
 Doing well on simvastatin  20 mg daily.  Check lipids today.  We discussed lifestyle modifications.  He is doing great job with diet and exercise.

## 2023-10-13 NOTE — Patient Instructions (Signed)
 It was very nice to see you today!  We will check blood work today.  Please let us  know if you have any change with your memory or if you would like to see the neurologist.  Please continue to work on diet and exercise.  Will see you back in 1 year.  Please come back to see us  sooner if needed.  Return in about 1 year (around 10/12/2024) for Annual Physical.   Take care, Dr Daneil Dunker  PLEASE NOTE:  If you had any lab tests, please let us  know if you have not heard back within a few days. You may see your results on mychart before we have a chance to review them but we will give you a call once they are reviewed by us .   If we ordered any referrals today, please let us  know if you have not heard from their office within the next week.   If you had any urgent prescriptions sent in today, please check with the pharmacy within an hour of our visit to make sure the prescription was transmitted appropriately.   Please try these tips to maintain a healthy lifestyle:  Eat at least 3 REAL meals and 1-2 snacks per day.  Aim for no more than 5 hours between eating.  If you eat breakfast, please do so within one hour of getting up.   Each meal should contain half fruits/vegetables, one quarter protein, and one quarter carbs (no bigger than a computer mouse)  Cut down on sweet beverages. This includes juice, soda, and sweet tea.   Drink at least 1 glass of water with each meal and aim for at least 8 glasses per day  Exercise at least 150 minutes every week.    Preventive Care 8 Years and Older, Male Preventive care refers to lifestyle choices and visits with your health care provider that can promote health and wellness. Preventive care visits are also called wellness exams. What can I expect for my preventive care visit? Counseling During your preventive care visit, your health care provider may ask about your: Medical history, including: Past medical problems. Family medical  history. History of falls. Current health, including: Emotional well-being. Home life and relationship well-being. Sexual activity. Memory and ability to understand (cognition). Lifestyle, including: Alcohol , nicotine or tobacco, and drug use. Access to firearms. Diet, exercise, and sleep habits. Work and work Astronomer. Sunscreen use. Safety issues such as seatbelt and bike helmet use. Physical exam Your health care provider will check your: Height and weight. These may be used to calculate your BMI (body mass index). BMI is a measurement that tells if you are at a healthy weight. Waist circumference. This measures the distance around your waistline. This measurement also tells if you are at a healthy weight and may help predict your risk of certain diseases, such as type 2 diabetes and high blood pressure. Heart rate and blood pressure. Body temperature. Skin for abnormal spots. What immunizations do I need?  Vaccines are usually given at various ages, according to a schedule. Your health care provider will recommend vaccines for you based on your age, medical history, and lifestyle or other factors, such as travel or where you work. What tests do I need? Screening Your health care provider may recommend screening tests for certain conditions. This may include: Lipid and cholesterol levels. Diabetes screening. This is done by checking your blood sugar (glucose) after you have not eaten for a while (fasting). Hepatitis C test. Hepatitis B test. HIV (  human immunodeficiency virus) test. STI (sexually transmitted infection) testing, if you are at risk. Lung cancer screening. Colorectal cancer screening. Prostate cancer screening. Abdominal aortic aneurysm (AAA) screening. You may need this if you are a current or former smoker. Talk with your health care provider about your test results, treatment options, and if necessary, the need for more tests. Follow these instructions at  home: Eating and drinking  Eat a diet that includes fresh fruits and vegetables, whole grains, lean protein, and low-fat dairy products. Limit your intake of foods with high amounts of sugar, saturated fats, and salt. Take vitamin and mineral supplements as recommended by your health care provider. Do not drink alcohol  if your health care provider tells you not to drink. If you drink alcohol : Limit how much you have to 0-2 drinks a day. Know how much alcohol  is in your drink. In the [place] one drink equals one 12 oz bottle of beer (355 mL), one 5 oz glass of wine (148 mL), or one 1 oz glass of hard liquor (44 mL). Lifestyle Brush your teeth every morning and night with fluoride toothpaste. Floss one time each day. Exercise for at least 30 minutes 5 or more days each week. Do not use any products that contain nicotine or tobacco. These products include cigarettes, chewing tobacco, and vaping devices, such as e-cigarettes. If you need help quitting, ask your health care provider. Do not use drugs. If you are sexually active, practice safe sex. Use a condom or other form of protection to prevent STIs. Take aspirin only as told by your health care provider. Make sure that you understand how much to take and what form to take. Work with your health care provider to find out whether it is safe and beneficial for you to take aspirin daily. Ask your health care provider if you need to take a cholesterol-lowering medicine (statin). Find healthy ways to manage stress, such as: Meditation, yoga, or listening to music. Journaling. Talking to a trusted person. Spending time with friends and family. Safety Always wear your seat belt while driving or riding in a vehicle. Do not drive: If you have been drinking alcohol . Do not ride with someone who has been drinking. When you are tired or distracted. While texting. If you have been using any mind-altering substances or drugs. Wear a helmet and other  protective equipment during sports activities. If you have firearms in your house, make sure you follow all gun safety procedures. Minimize exposure to UV radiation to reduce your risk of skin cancer. What's next? Visit your health care provider once a year for an annual wellness visit. Ask your health care provider how often you should have your eyes and teeth checked. Stay up to date on all vaccines. This information is not intended to replace advice given to you by your health care provider. Make sure you discuss any questions you have with your health care provider. Document Revised: 12/06/2020 Document Reviewed: 12/06/2020 Elsevier Patient Education  2024 [employer].

## 2023-10-14 LAB — HEPATITIS C ANTIBODY: Hepatitis C Ab: NONREACTIVE

## 2023-10-16 ENCOUNTER — Encounter: Payer: Self-pay | Admitting: Family Medicine

## 2023-10-16 DIAGNOSIS — E538 Deficiency of other specified B group vitamins: Secondary | ICD-10-CM | POA: Insufficient documentation

## 2023-10-16 NOTE — Progress Notes (Signed)
 Is being 10 is low.  This may be contributing some to his forgetfulness.  Recommend he start B12 protocol here.  We should recheck B12 level in 3 to 6 months.  His A1c is borderline but stable compared to previous.  The rest of his labs are all at goal.  Do not need to make any other changes to his treatment plan at this time.  He should continue to work on diet and exercise and we can recheck everything else in a year or so.

## 2023-10-22 ENCOUNTER — Ambulatory Visit (INDEPENDENT_AMBULATORY_CARE_PROVIDER_SITE_OTHER): Admitting: *Deleted

## 2023-10-22 DIAGNOSIS — E538 Deficiency of other specified B group vitamins: Secondary | ICD-10-CM | POA: Diagnosis not present

## 2023-10-22 MED ORDER — CYANOCOBALAMIN 1000 MCG/ML IJ SOLN
1000.0000 ug | Freq: Once | INTRAMUSCULAR | Status: AC
  Administered 2023-10-22: 1000 ug via INTRAMUSCULAR

## 2023-10-22 NOTE — Progress Notes (Signed)
Patient presents for B12 injection today. Patient received her B12 injection in left Deltoid. Patient tolerated injection well.  Documentation entered in MAR in EpicCare.   

## 2023-10-28 ENCOUNTER — Ambulatory Visit (INDEPENDENT_AMBULATORY_CARE_PROVIDER_SITE_OTHER): Payer: Medicare Other

## 2023-10-28 VITALS — Ht 69.0 in | Wt 154.0 lb

## 2023-10-28 DIAGNOSIS — Z0000 Encounter for general adult medical examination without abnormal findings: Secondary | ICD-10-CM | POA: Diagnosis not present

## 2023-10-28 NOTE — Patient Instructions (Signed)
 Mr. Ismaili , Thank you for taking time to come for your Medicare Wellness Visit. I appreciate your ongoing commitment to your health goals. Please review the following plan we discussed and let me know if I can assist you in the future.   Referrals/Orders/Follow-Ups/Clinician Recommendations: maintain health and activity   This is a list of the screening recommended for you and due dates:  Health Maintenance  Topic Date Due   COVID-19 Vaccine (8 - 2024-25 season) 10/29/2023*   Flu Shot  01/23/2024   Colon Cancer Screening  08/08/2024   Medicare Annual Wellness Visit  10/27/2024   DTaP/Tdap/Td vaccine (2 - Td or Tdap) 08/25/2028   Pneumonia Vaccine  Completed   Hepatitis C Screening  Completed   Zoster (Shingles) Vaccine  Completed   HPV Vaccine  Aged Out   Meningitis B Vaccine  Aged Out  *Topic was postponed. The date shown is not the original due date.    Advanced directives: (Copy Requested) Please bring a copy of your health care power of attorney and living will to the office to be added to your chart at your convenience. You can mail to [facility] [address] [place], [place] [address] or email to [email or web address]  Next Medicare Annual Wellness Visit scheduled for next year: Yes  Have you seen your provider in the last 6 months (3 months if uncontrolled diabetes)? Yes 10/13/23

## 2023-10-28 NOTE — Progress Notes (Signed)
 Subjective:   Jacob Hood is a 73 y.o. who presents for a Medicare Wellness preventive visit.  Visit Complete: Virtual I connected with  Hal Levans on 10/28/23 by a audio enabled telemedicine application and verified that I am speaking with the correct person using two identifiers.  Patient Location: Home  Provider Location: Office/Clinic  I discussed the limitations of evaluation and management by telemedicine. The patient expressed understanding and agreed to proceed.  Vital Signs: Because this visit was a virtual/telehealth visit, some criteria may be missing or patient reported. Any vitals not documented were not able to be obtained and vitals that have been documented are patient reported.  VideoDeclined- This patient declined Librarian, academic. Therefore the visit was completed with audio only.  Persons Participating in Visit: Patient.  AWV Questionnaire: No: Patient Medicare AWV questionnaire was not completed prior to this visit.  Cardiac Risk Factors include: advanced age (>72men, >32 women);dyslipidemia;hypertension;male gender     Objective:    Today's Vitals   10/28/23 1042  Weight: 154 lb (69.9 kg)  Height: 5\' 9"  (1.753 m)   Body mass index is 22.74 kg/m.     10/28/2023   10:45 AM 10/24/2022    9:10 AM 10/19/2021   10:45 AM 09/14/2020    2:25 PM  Advanced Directives  Does Patient Have a Medical Advance Directive? Yes Yes Yes Yes  Type of Estate agent of [place];Living will Healthcare Power of [place];Living will Healthcare Power of [place];Living will Healthcare Power of Attorney  Does patient want to make changes to medical advance directive?    No - Patient declined  Copy of Healthcare Power of Attorney in Chart? No - copy requested No - copy requested No - copy requested No - copy requested    Current Medications (verified) Outpatient Encounter Medications as of 10/28/2023  Medication Sig    Calcium Carbonate-Vitamin D 600-10 MG-MCG TABS Take 1 tablet by mouth.   Carboxymethylcellulose Sodium (REFRESH LIQUIGEL OP) Apply to eye.   Cholecalciferol (VITAMIN D3) 125 MCG (5000 UT) CAPS 150 capsules.   co-enzyme Q-10 30 MG capsule Take 30 mg by mouth 3 (three) times daily.   simvastatin  (ZOCOR ) 20 MG tablet Take 1 tablet (20 mg total) by mouth daily.   No facility-administered encounter medications on file as of 10/28/2023.    Allergies (verified) Penicillins   History: Past Medical History:  Diagnosis Date   Hyperlipidemia    Syncope 09/08/2020   Past Surgical History:  Procedure Laterality Date   TONSILLECTOMY     Family History  Problem Relation Age of Onset   Dementia Mother    Heart attack Father    Cancer Neg Hx    Social History   Socioeconomic History   Marital status: Married    Spouse name: Not on file   Number of children: Not on file   Years of education: Not on file   Highest education level: Doctorate  Occupational History   Not on file  Tobacco Use   Smoking status: Former   Smokeless tobacco: Never  Vaping Use   Vaping status: Never Used  Substance and Sexual Activity   Alcohol  use: Yes    Comment: Socially   Drug use: Never   Sexual activity: Yes  Other Topics Concern   Not on file  Social History Narrative   Not on file   Social Drivers of Health   Financial Resource Strain: Low Risk  (10/28/2023)   Overall Physicist, medical Strain (  CARDIA)    Difficulty of Paying Living Expenses: Not hard at all  Food Insecurity: No Food Insecurity (10/28/2023)   Hunger Vital Sign    Worried About Running Out of Food in the Last Year: Never true    Ran Out of Food in the Last Year: Never true  Transportation Needs: No Transportation Needs (10/28/2023)   PRAPARE - Administrator, Civil Service (Medical): No    Lack of Transportation (Non-Medical): No  Physical Activity: Sufficiently Active (10/28/2023)   Exercise Vital Sign    Days of  Exercise per Week: 6 days    Minutes of Exercise per Session: 60 min  Stress: No Stress Concern Present (10/28/2023)   [name] of Occupational Health - Occupational Stress Questionnaire    Feeling of Stress : Not at all  Social Connections: Socially Integrated (10/28/2023)   Social Connection and Isolation Panel [NHANES]    Frequency of Communication with Friends and Family: More than three times a week    Frequency of Social Gatherings with Friends and Family: More than three times a week    Attends Religious Services: More than 4 times per year    Active Member of [employer] or Organizations: Yes    Attends Banker Meetings: 1 to 4 times per year    Marital Status: Married    Tobacco Counseling Counseling given: Not Answered    Clinical Intake:  Pre-visit preparation completed: Yes  Pain : No/denies pain     BMI - recorded: 22.74 Nutritional Status: BMI of 19-24  Normal Nutritional Risks: None Diabetes: No  Lab Results  Component Value Date   HGBA1C 6.0 10/13/2023   HGBA1C 6.0 10/09/2022   HGBA1C 6.0 09/11/2021     How often do you need to have someone help you when you read instructions, pamphlets, or other written materials from your doctor or pharmacy?: 1 - Never  Interpreter Needed?: No  Information entered by :: Lamont Pilsner, LPN   Activities of Daily Living     10/28/2023   10:43 AM  In your present state of health, do you have any difficulty performing the following activities:  Hearing? 0  Vision? 0  Difficulty concentrating or making decisions? 0  Walking or climbing stairs? 0  Dressing or bathing? 0  Doing errands, shopping? 0  Preparing Food and eating ? N  Using the Toilet? N  In the past six months, have you accidently leaked urine? N  Do you have problems with loss of bowel control? N  Managing your Medications? N  Managing your Finances? N  Housekeeping or managing your Housekeeping? N    Patient Care Team: Rodney Clamp, MD as PCP - General (Family Medicine)  Indicate any recent Medical Services you may have received from other than [facility] providers in the past year (date may be approximate).     Assessment:   This is a routine wellness examination for [employer].  Hearing/Vision screen Hearing Screening - Comments:: Pt denies any hearing issues  Vision Screening - Comments:: Wears rx glasses - up to date with routine eye exams with Dr Cindra Cree [facility] ophthalmology     Goals Addressed             This Visit's Progress    Patient Stated       Maintain  health and activity        Depression Screen     10/28/2023   10:46 AM 10/13/2023    9:18  AM 02/19/2023    7:57 AM 10/24/2022    9:09 AM 10/09/2022    7:30 AM 06/11/2022    9:31 AM 10/19/2021   10:43 AM  PHQ 2/9 Scores  PHQ - 2 Score 0 0 0 0 0 0 0    Fall Risk     10/28/2023   10:48 AM 10/13/2023    9:19 AM 10/24/2022    9:11 AM 10/09/2022    7:30 AM 06/11/2022    9:31 AM  Fall Risk   Falls in the past year? 0 0 0 0 0  Number falls in past yr: 0 0 0 0 0  Injury with Fall? 0 0 0 0 0  Risk for fall due to : No Fall Risks No Fall Risks Impaired vision No Fall Risks No Fall Risks  Follow up Falls prevention discussed  Falls prevention discussed Falls prevention discussed     MEDICARE RISK AT HOME:  Medicare Risk at Home Any stairs in or around the home?: No If so, are there any without handrails?: No Home free of loose throw rugs in walkways, pet beds, electrical cords, etc?: Yes Adequate lighting in your home to reduce risk of falls?: Yes Life alert?: Yes Use of a cane, walker or w/c?: No Grab bars in the bathroom?: Yes Shower chair or bench in shower?: Yes Elevated toilet seat or a handicapped toilet?: Yes  TIMED UP AND GO:  Was the test performed?  No  Cognitive Function: 6CIT completed        10/28/2023   10:49 AM 10/24/2022    9:11 AM 10/19/2021   10:47 AM  6CIT Screen  What Year? 0 points 0 points 0 points  What  month? 0 points 0 points 0 points  What time? 0 points 0 points 0 points  Count back from 20 0 points 0 points 0 points  Months in reverse 0 points 0 points 0 points  Repeat phrase 0 points 0 points 0 points  Total Score 0 points 0 points 0 points    Immunizations Immunization History  Administered Date(s) Administered   Fluad Quad(high Dose 65+) 03/07/2022, 03/27/2023   Fluad Trivalent(High Dose 65+) 04/03/2017   Influenza-Unspecified 03/16/2018, 03/24/2020, 04/12/2021   Moderna Covid-19 Fall Seasonal Vaccine 108yrs & older 03/27/2023   PFIZER(Purple Top)SARS-COV-2 Vaccination 07/13/2019, 08/03/2019, 03/28/2020   PNEUMOCOCCAL CONJUGATE-20 09/11/2021   Pfizer Covid-19 Vaccine Bivalent Booster 42yrs & up 09/28/2020, 03/15/2021, 03/28/2022   Rsv, Bivalent, Protein Subunit Rsvpref,pf Pattricia Bores) 03/07/2022, 03/13/2022   Tdap 08/26/2018   Zoster Recombinant(Shingrix) 01/29/2019, 05/06/2019    Screening Tests Health Maintenance  Topic Date Due   COVID-19 Vaccine (8 - 2024-25 season) 10/29/2023 (Originally 09/25/2023)   INFLUENZA VACCINE  01/23/2024   Colonoscopy  08/08/2024   Medicare Annual Wellness (AWV)  10/27/2024   DTaP/Tdap/Td (2 - Td or Tdap) 08/25/2028   Pneumonia Vaccine 83+ Years old  Completed   Hepatitis C Screening  Completed   Zoster Vaccines- Shingrix  Completed   HPV VACCINES  Aged Out   Meningococcal B Vaccine  Aged Out    Health Maintenance  There are no preventive care reminders to display for this patient.  Health Maintenance Items Addressed: See Nurse Notes  Additional Screening:  Vision Screening: Recommended annual ophthalmology exams for early detection of glaucoma and other disorders of the eye.  Dental Screening: Recommended annual dental exams for proper oral hygiene  Community Resource Referral / Chronic Care Management: CRR required this visit?  No   CCM required  this visit?  No     Plan:     I have personally reviewed and noted the  following in the patient's chart:   Medical and social history Use of alcohol , tobacco or illicit drugs  Current medications and supplements including opioid prescriptions. Patient is not currently taking opioid prescriptions. Functional ability and status Nutritional status Physical activity Advanced directives List of other physicians Hospitalizations, surgeries, and ER visits in previous 12 months Vitals Screenings to include cognitive, depression, and falls Referrals and appointments  In addition, I have reviewed and discussed with patient certain preventive protocols, quality metrics, and best practice recommendations. A written personalized care plan for preventive services as well as general preventive health recommendations were provided to patient.     Bruno Capri, LPN   06/29/1094   After Visit Summary: (MyChart) Due to this being a telephonic visit, the after visit summary with patients personalized plan was offered to patient via MyChart   Notes: Nothing significant to report at this time.

## 2023-10-29 ENCOUNTER — Ambulatory Visit (INDEPENDENT_AMBULATORY_CARE_PROVIDER_SITE_OTHER): Admitting: *Deleted

## 2023-10-29 ENCOUNTER — Encounter (HOSPITAL_COMMUNITY): Payer: Self-pay

## 2023-10-29 DIAGNOSIS — E538 Deficiency of other specified B group vitamins: Secondary | ICD-10-CM | POA: Diagnosis not present

## 2023-10-29 MED ORDER — CYANOCOBALAMIN 1000 MCG/ML IJ SOLN
1000.0000 ug | Freq: Once | INTRAMUSCULAR | Status: AC
  Administered 2023-10-29: 1000 ug via INTRAMUSCULAR

## 2023-10-29 NOTE — Progress Notes (Signed)
 Patient presents for B12 injection today. Patient received her B12 injection in Right Deltoid. Patient tolerated injection well.  Documentation entered in [facility] in EpicCare.

## 2023-11-03 DIAGNOSIS — D485 Neoplasm of uncertain behavior of skin: Secondary | ICD-10-CM | POA: Diagnosis not present

## 2023-11-03 DIAGNOSIS — D1801 Hemangioma of skin and subcutaneous tissue: Secondary | ICD-10-CM | POA: Diagnosis not present

## 2023-11-03 DIAGNOSIS — L718 Other rosacea: Secondary | ICD-10-CM | POA: Diagnosis not present

## 2023-11-03 DIAGNOSIS — L281 Prurigo nodularis: Secondary | ICD-10-CM | POA: Diagnosis not present

## 2023-11-03 DIAGNOSIS — L570 Actinic keratosis: Secondary | ICD-10-CM | POA: Diagnosis not present

## 2023-11-03 DIAGNOSIS — L720 Epidermal cyst: Secondary | ICD-10-CM | POA: Diagnosis not present

## 2023-11-03 DIAGNOSIS — L821 Other seborrheic keratosis: Secondary | ICD-10-CM | POA: Diagnosis not present

## 2023-11-03 DIAGNOSIS — L814 Other melanin hyperpigmentation: Secondary | ICD-10-CM | POA: Diagnosis not present

## 2023-11-05 ENCOUNTER — Ambulatory Visit: Admitting: *Deleted

## 2023-11-05 DIAGNOSIS — E538 Deficiency of other specified B group vitamins: Secondary | ICD-10-CM | POA: Diagnosis not present

## 2023-11-05 MED ORDER — CYANOCOBALAMIN 1000 MCG/ML IJ SOLN
1000.0000 ug | Freq: Once | INTRAMUSCULAR | Status: AC
Start: 2023-11-05 — End: 2023-11-05
  Administered 2023-11-05: 1000 ug via INTRAMUSCULAR

## 2023-11-05 NOTE — Progress Notes (Signed)
Patient presents for B12 injection today. Patient received her B12 injection in left Deltoid. Patient tolerated injection well.  Documentation entered in MAR in EpicCare.   

## 2023-11-12 ENCOUNTER — Ambulatory Visit (INDEPENDENT_AMBULATORY_CARE_PROVIDER_SITE_OTHER): Admitting: *Deleted

## 2023-11-12 DIAGNOSIS — E538 Deficiency of other specified B group vitamins: Secondary | ICD-10-CM | POA: Diagnosis not present

## 2023-11-12 MED ORDER — CYANOCOBALAMIN 1000 MCG/ML IJ SOLN
1000.0000 ug | Freq: Once | INTRAMUSCULAR | Status: AC
  Administered 2023-11-12: 1000 ug via INTRAMUSCULAR

## 2023-11-12 NOTE — Progress Notes (Signed)
 Patient presents for B12 injection today. Patient received her B12 injection in Right Deltoid. Patient tolerated injection well.  Documentation entered in [facility] in EpicCare.

## 2023-12-10 ENCOUNTER — Ambulatory Visit (INDEPENDENT_AMBULATORY_CARE_PROVIDER_SITE_OTHER): Admitting: *Deleted

## 2023-12-10 DIAGNOSIS — E538 Deficiency of other specified B group vitamins: Secondary | ICD-10-CM

## 2023-12-10 MED ORDER — CYANOCOBALAMIN 1000 MCG/ML IJ SOLN
1000.0000 ug | Freq: Once | INTRAMUSCULAR | Status: AC
  Administered 2023-12-10: 1000 ug via INTRAMUSCULAR

## 2023-12-10 NOTE — Progress Notes (Signed)
 Patient presents for B12 injection today. Patient received her B12 injection in Right Deltoid. Patient tolerated injection well.  Documentation entered in [facility] in EpicCare.

## 2023-12-19 ENCOUNTER — Other Ambulatory Visit: Payer: Self-pay | Admitting: Medical Genetics

## 2023-12-23 ENCOUNTER — Other Ambulatory Visit (HOSPITAL_COMMUNITY)
Admission: RE | Admit: 2023-12-23 | Discharge: 2023-12-23 | Disposition: A | Discharge status: Home / Self Care | Payer: Self-pay | Source: Ambulatory Visit | Attending: Medical Genetics | Admitting: Medical Genetics

## 2024-01-06 LAB — GENECONNECT MOLECULAR SCREEN: Genetic Analysis Overall Interpretation: NEGATIVE

## 2024-01-14 ENCOUNTER — Ambulatory Visit (INDEPENDENT_AMBULATORY_CARE_PROVIDER_SITE_OTHER)

## 2024-01-14 DIAGNOSIS — E538 Deficiency of other specified B group vitamins: Secondary | ICD-10-CM | POA: Diagnosis not present

## 2024-01-14 MED ORDER — CYANOCOBALAMIN 1000 MCG/ML IJ SOLN
1000.0000 ug | Freq: Once | INTRAMUSCULAR | Status: AC
  Administered 2024-01-14: 1000 ug via INTRAMUSCULAR

## 2024-01-14 NOTE — Progress Notes (Signed)
 Patient is in office today for a nurse visit for B12 Injection. Patient Injection was given in the  Left deltoid. Patient tolerated injection well. Pt aware of next appt.

## 2024-02-11 ENCOUNTER — Other Ambulatory Visit: Payer: Self-pay | Admitting: *Deleted

## 2024-02-11 ENCOUNTER — Ambulatory Visit

## 2024-02-11 DIAGNOSIS — E538 Deficiency of other specified B group vitamins: Secondary | ICD-10-CM

## 2024-02-11 MED ORDER — CYANOCOBALAMIN 1000 MCG/ML IJ SOLN: 1000.0000 ug | Freq: Once | INTRAMUSCULAR | 0 refills | Status: DC

## 2024-02-11 MED ORDER — CYANOCOBALAMIN 1000 MCG/ML IJ SOLN
1000.0000 ug | Freq: Once | INTRAMUSCULAR | Status: AC
  Administered 2024-02-11: 1000 ug via INTRAMUSCULAR

## 2024-02-11 NOTE — Progress Notes (Signed)
 Patient received Cyanocobalamin  1000 mcg/mL injection today, IM, right deltoid, tolerated well.   Medical Alleen armin Peter  LOT#: [id number] EXP: 2026/07 NDC: [id number]

## 2024-02-25 ENCOUNTER — Other Ambulatory Visit (INDEPENDENT_AMBULATORY_CARE_PROVIDER_SITE_OTHER)

## 2024-02-25 DIAGNOSIS — E538 Deficiency of other specified B group vitamins: Secondary | ICD-10-CM | POA: Diagnosis not present

## 2024-02-25 LAB — VITAMIN B12: Vitamin B-12: 402 pg/mL (ref 211–911)

## 2024-02-26 ENCOUNTER — Ambulatory Visit: Payer: Self-pay | Admitting: Family Medicine

## 2024-02-26 NOTE — Progress Notes (Signed)
 B12 level is back at goal.  Recommend he continue supplementation 1000 mcg daily and we can recheck again in 3 to 6 months.

## 2024-10-14 ENCOUNTER — Ambulatory Visit: Admitting: Family Medicine

## 2024-11-04 ENCOUNTER — Ambulatory Visit
